# Patient Record
Sex: Female | Born: 2000
Health system: Southern US, Community
[De-identification: ages and names within clinical notes are randomized; demographics above are authoritative.]

## PROBLEM LIST (undated history)

## (undated) DIAGNOSIS — F419 Anxiety disorder, unspecified: Secondary | ICD-10-CM

## (undated) DIAGNOSIS — R569 Unspecified convulsions: Secondary | ICD-10-CM

## (undated) DIAGNOSIS — J45909 Unspecified asthma, uncomplicated: Secondary | ICD-10-CM

## (undated) HISTORY — PX: WISDOM TOOTH EXTRACTION: SHX21

## (undated) HISTORY — DX: Anxiety disorder, unspecified: F41.9

## (undated) HISTORY — DX: Unspecified convulsions: R56.9

---
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
## 2018-09-20 DIAGNOSIS — Z23 Encounter for immunization: Secondary | ICD-10-CM | POA: Diagnosis not present

## 2019-09-29 DIAGNOSIS — S5012XA Contusion of left forearm, initial encounter: Secondary | ICD-10-CM | POA: Diagnosis not present

## 2019-09-29 DIAGNOSIS — M79602 Pain in left arm: Secondary | ICD-10-CM | POA: Diagnosis not present

## 2019-11-16 ENCOUNTER — Other Ambulatory Visit: Payer: Self-pay

## 2019-11-16 DIAGNOSIS — R55 Syncope and collapse: Secondary | ICD-10-CM | POA: Diagnosis not present

## 2019-11-16 DIAGNOSIS — E876 Hypokalemia: Secondary | ICD-10-CM | POA: Diagnosis not present

## 2019-11-16 DIAGNOSIS — Z5321 Procedure and treatment not carried out due to patient leaving prior to being seen by health care provider: Secondary | ICD-10-CM | POA: Insufficient documentation

## 2019-11-16 DIAGNOSIS — J45909 Unspecified asthma, uncomplicated: Secondary | ICD-10-CM | POA: Insufficient documentation

## 2019-11-16 DIAGNOSIS — R000 Tachycardia, unspecified: Secondary | ICD-10-CM | POA: Diagnosis not present

## 2019-11-16 DIAGNOSIS — Z20828 Contact with and (suspected) exposure to other viral communicable diseases: Secondary | ICD-10-CM | POA: Diagnosis not present

## 2019-11-16 DIAGNOSIS — R569 Unspecified convulsions: Principal | ICD-10-CM | POA: Insufficient documentation

## 2019-11-16 DIAGNOSIS — G259 Extrapyramidal and movement disorder, unspecified: Secondary | ICD-10-CM | POA: Diagnosis not present

## 2019-11-17 ENCOUNTER — Emergency Department (HOSPITAL_COMMUNITY)
Admission: EM | Admit: 2019-11-17 | Discharge: 2019-11-17 | Disposition: A | Discharge status: Home / Self Care | Payer: BC Managed Care – PPO | Source: Home / Self Care

## 2019-11-17 ENCOUNTER — Observation Stay (HOSPITAL_COMMUNITY)
Admission: EM | Admit: 2019-11-17 | Discharge: 2019-11-17 | Disposition: A | Discharge status: Home / Self Care | Payer: BC Managed Care – PPO | Attending: Family Medicine | Admitting: Family Medicine

## 2019-11-17 ENCOUNTER — Observation Stay (HOSPITAL_COMMUNITY): Payer: BC Managed Care – PPO

## 2019-11-17 ENCOUNTER — Other Ambulatory Visit: Payer: Self-pay

## 2019-11-17 ENCOUNTER — Encounter (HOSPITAL_COMMUNITY): Payer: Self-pay | Admitting: *Deleted

## 2019-11-17 ENCOUNTER — Encounter (HOSPITAL_COMMUNITY): Payer: Self-pay

## 2019-11-17 DIAGNOSIS — R52 Pain, unspecified: Secondary | ICD-10-CM | POA: Diagnosis not present

## 2019-11-17 DIAGNOSIS — G4489 Other headache syndrome: Secondary | ICD-10-CM | POA: Diagnosis not present

## 2019-11-17 DIAGNOSIS — R569 Unspecified convulsions: Secondary | ICD-10-CM

## 2019-11-17 DIAGNOSIS — E876 Hypokalemia: Secondary | ICD-10-CM | POA: Diagnosis present

## 2019-11-17 DIAGNOSIS — R55 Syncope and collapse: Secondary | ICD-10-CM | POA: Insufficient documentation

## 2019-11-17 DIAGNOSIS — Z5321 Procedure and treatment not carried out due to patient leaving prior to being seen by health care provider: Secondary | ICD-10-CM | POA: Insufficient documentation

## 2019-11-17 DIAGNOSIS — R410 Disorientation, unspecified: Secondary | ICD-10-CM | POA: Diagnosis not present

## 2019-11-17 DIAGNOSIS — G259 Extrapyramidal and movement disorder, unspecified: Secondary | ICD-10-CM

## 2019-11-17 LAB — CK TOTAL AND CKMB (NOT AT ARMC)
CK, MB: 1 ng/mL (ref 0.5–5.0)
Relative Index: INVALID (ref 0.0–2.5)
Total CK: 58 U/L (ref 38–234)

## 2019-11-17 LAB — URINALYSIS, ROUTINE W REFLEX MICROSCOPIC
Bilirubin Urine: NEGATIVE
Glucose, UA: NEGATIVE mg/dL
Ketones, ur: NEGATIVE mg/dL
Leukocytes,Ua: NEGATIVE
Nitrite: NEGATIVE
Protein, ur: NEGATIVE mg/dL
Specific Gravity, Urine: 1.021 (ref 1.005–1.030)
pH: 6 (ref 5.0–8.0)

## 2019-11-17 LAB — CBC
HCT: 39.1 % (ref 36.0–46.0)
Hemoglobin: 13.7 g/dL (ref 12.0–15.0)
MCH: 31.9 pg (ref 26.0–34.0)
MCHC: 35 g/dL (ref 30.0–36.0)
MCV: 90.9 fL (ref 80.0–100.0)
Platelets: 272 10*3/uL (ref 150–400)
RBC: 4.3 MIL/uL (ref 3.87–5.11)
RDW: 11.5 % (ref 11.5–15.5)
WBC: 6.2 10*3/uL (ref 4.0–10.5)
nRBC: 0 % (ref 0.0–0.2)

## 2019-11-17 LAB — IRON AND TIBC
Iron: 21 ug/dL — ABNORMAL LOW (ref 28–170)
Saturation Ratios: 8 % — ABNORMAL LOW (ref 10.4–31.8)
TIBC: 276 ug/dL (ref 250–450)
UIBC: 255 ug/dL

## 2019-11-17 LAB — COMPREHENSIVE METABOLIC PANEL
ALT: 12 U/L (ref 0–44)
ALT: 13 U/L (ref 0–44)
AST: 18 U/L (ref 15–41)
AST: 19 U/L (ref 15–41)
Albumin: 4.3 g/dL (ref 3.5–5.0)
Albumin: 4 g/dL (ref 3.5–5.0)
Alkaline Phosphatase: 46 U/L (ref 38–126)
Alkaline Phosphatase: 42 U/L (ref 38–126)
Anion gap: 8 (ref 5–15)
Anion gap: 10 (ref 5–15)
BUN: 15 mg/dL (ref 6–20)
BUN: 13 mg/dL (ref 6–20)
CO2: 24 mmol/L (ref 22–32)
CO2: 22 mmol/L (ref 22–32)
Calcium: 9.3 mg/dL (ref 8.9–10.3)
Calcium: 8.8 mg/dL — ABNORMAL LOW (ref 8.9–10.3)
Chloride: 107 mmol/L (ref 98–111)
Chloride: 107 mmol/L (ref 98–111)
Creatinine, Ser: 0.76 mg/dL (ref 0.44–1.00)
Creatinine, Ser: 0.72 mg/dL (ref 0.44–1.00)
GFR calc Af Amer: >60 mL/min (ref >60)
GFR calc Af Amer: >60 mL/min (ref >60)
GFR calc non Af Amer: >60 mL/min (ref >60)
GFR calc non Af Amer: >60 mL/min (ref >60)
Glucose, Bld: 122 mg/dL — ABNORMAL HIGH (ref 70–99)
Glucose, Bld: 95 mg/dL (ref 70–99)
Potassium: 3.3 mmol/L — ABNORMAL LOW (ref 3.5–5.1)
Potassium: 3.5 mmol/L (ref 3.5–5.1)
Sodium: 139 mmol/L (ref 135–145)
Sodium: 139 mmol/L (ref 135–145)
Total Bilirubin: 0.8 mg/dL (ref 0.3–1.2)
Total Bilirubin: 0.7 mg/dL (ref 0.3–1.2)
Total Protein: 7.2 g/dL (ref 6.5–8.1)
Total Protein: 6.7 g/dL (ref 6.5–8.1)

## 2019-11-17 LAB — FERRITIN: Ferritin: 32 ng/mL (ref 11–307)

## 2019-11-17 LAB — HIV ANTIBODY (ROUTINE TESTING W REFLEX): HIV Screen 4th Generation wRfx: NONREACTIVE

## 2019-11-17 LAB — VITAMIN B12: Vitamin B-12: 335 pg/mL (ref 180–914)

## 2019-11-17 LAB — CBC WITH DIFFERENTIAL/PLATELET
Abs Immature Granulocytes: 0.02 10*3/uL (ref 0.00–0.07)
Basophils Absolute: 0 10*3/uL (ref 0.0–0.1)
Basophils Relative: 1 %
Eosinophils Absolute: 0.1 10*3/uL (ref 0.0–0.5)
Eosinophils Relative: 2 %
HCT: 36.9 % (ref 36.0–46.0)
Hemoglobin: 12.8 g/dL (ref 12.0–15.0)
Immature Granulocytes: 0 %
Lymphocytes Relative: 24 %
Lymphs Abs: 1.2 10*3/uL (ref 0.7–4.0)
MCH: 31.4 pg (ref 26.0–34.0)
MCHC: 34.7 g/dL (ref 30.0–36.0)
MCV: 90.4 fL (ref 80.0–100.0)
Monocytes Absolute: 0.4 10*3/uL (ref 0.1–1.0)
Monocytes Relative: 7 %
Neutro Abs: 3.2 10*3/uL (ref 1.7–7.7)
Neutrophils Relative %: 66 %
Platelets: 255 10*3/uL (ref 150–400)
RBC: 4.08 MIL/uL (ref 3.87–5.11)
RDW: 11.4 % — ABNORMAL LOW (ref 11.5–15.5)
WBC: 4.9 10*3/uL (ref 4.0–10.5)
nRBC: 0 % (ref 0.0–0.2)

## 2019-11-17 LAB — URINALYSIS, MICROSCOPIC (REFLEX)

## 2019-11-17 LAB — RAPID URINE DRUG SCREEN, HOSP PERFORMED
Amphetamines: NOT DETECTED
Barbiturates: NOT DETECTED
Benzodiazepines: NOT DETECTED
Cocaine: NOT DETECTED
Opiates: NOT DETECTED
Tetrahydrocannabinol: POSITIVE — AB

## 2019-11-17 LAB — PREGNANCY, URINE: Preg Test, Ur: NEGATIVE

## 2019-11-17 LAB — SARS CORONAVIRUS 2 (TAT 6-24 HRS): SARS Coronavirus 2: NEGATIVE

## 2019-11-17 LAB — TSH: TSH: 2.605 u[IU]/mL (ref 0.350–4.500)

## 2019-11-17 LAB — SEDIMENTATION RATE: Sed Rate: 8 mm/hr (ref 0–22)

## 2019-11-17 LAB — TROPONIN I (HIGH SENSITIVITY)
Troponin I (High Sensitivity): <2 ng/L (ref <18)
Troponin I (High Sensitivity): 2 ng/L (ref <18)

## 2019-11-17 LAB — C-REACTIVE PROTEIN: CRP: 0.5 mg/dL (ref <1.0)

## 2019-11-17 LAB — MAGNESIUM: Magnesium: 2.1 mg/dL (ref 1.7–2.4)

## 2019-11-17 MED ORDER — GADOBUTROL 1 MMOL/ML IV SOLN
5.0000 mL | Freq: Once | INTRAVENOUS | Status: AC | PRN
  Administered 2019-11-17: 5 mL via INTRAVENOUS

## 2019-11-17 MED ORDER — ACETAMINOPHEN 325 MG PO TABS: 650.0000 mg | ORAL_TABLET | Freq: Four times a day (QID) | ORAL | Status: DC | PRN

## 2019-11-17 MED ORDER — LORAZEPAM 2 MG/ML IJ SOLN
1.0000 mg | Freq: Once | INTRAMUSCULAR | Status: AC
  Administered 2019-11-17: 1 mg via INTRAVENOUS
  Filled 2019-11-17: qty 1

## 2019-11-17 MED ORDER — SODIUM CHLORIDE 0.9% FLUSH
3.0000 mL | Freq: Two times a day (BID) | INTRAVENOUS | Status: DC
  Administered 2019-11-17: 3 mL via INTRAVENOUS

## 2019-11-17 MED ORDER — DEXTROSE 5 % IV SOLN
1000.0000 mg | Freq: Once | INTRAVENOUS | Status: AC
  Administered 2019-11-17: 1000 mg via INTRAVENOUS
  Filled 2019-11-17: qty 10

## 2019-11-17 MED ORDER — SENNOSIDES-DOCUSATE SODIUM 8.6-50 MG PO TABS: 1.0000 | ORAL_TABLET | Freq: Every evening | ORAL | Status: DC | PRN

## 2019-11-17 MED ORDER — SODIUM CHLORIDE 0.9% FLUSH: 3.0000 mL | INTRAVENOUS | Status: DC | PRN

## 2019-11-17 MED ORDER — POTASSIUM CHLORIDE CRYS ER 20 MEQ PO TBCR
40.0000 meq | EXTENDED_RELEASE_TABLET | Freq: Once | ORAL | Status: AC
  Administered 2019-11-17: 40 meq via ORAL
  Filled 2019-11-17: qty 2

## 2019-11-17 MED ORDER — SODIUM CHLORIDE 0.9% FLUSH: 3.0000 mL | Freq: Two times a day (BID) | INTRAVENOUS | Status: DC

## 2019-11-17 MED ORDER — SODIUM CHLORIDE 0.9 % IV SOLN: 250.0000 mL | INTRAVENOUS | Status: DC | PRN

## 2019-11-17 MED ORDER — ONDANSETRON HCL 4 MG PO TABS: 4.0000 mg | ORAL_TABLET | Freq: Four times a day (QID) | ORAL | Status: DC | PRN

## 2019-11-17 MED ORDER — SODIUM CHLORIDE 0.9% FLUSH: 3.0000 mL | Freq: Once | INTRAVENOUS | Status: DC

## 2019-11-17 MED ORDER — ONDANSETRON HCL 4 MG/2ML IJ SOLN: 4.0000 mg | Freq: Four times a day (QID) | INTRAMUSCULAR | Status: DC | PRN

## 2019-11-17 MED ORDER — ACETAMINOPHEN 650 MG RE SUPP: 650.0000 mg | Freq: Four times a day (QID) | RECTAL | Status: DC | PRN

## 2019-11-17 NOTE — Procedures (Signed)
ELECTROENCEPHALOGRAM REPORT   Patient: Natalie Simmons       Room #: 038C EEG No. ID: [id number] Age: 18 y.o.        Sex: female Referring Physician: Pahwani Report Date:  11/17/2019        Interpreting Physician: Alexis Goodell  History: Natalie Simmons is an 18 y.o. female with shaking episodes  Medications:  None  Conditions of Recording:  This is a 21 channel routine scalp EEG performed with bipolar and monopolar montages arranged in accordance to the international 10/20 system of electrode placement. One channel was dedicated to EKG recording.  The patient is in the awake, drowsy and asleep states.  Description:  The waking background activity consists of a low voltage, symmetrical, fairly well organized, 11 Hz alpha activity, seen from the parieto-occipital and posterior temporal regions.  Low voltage fast activity, poorly organized, is seen anteriorly and is at times superimposed on more posterior regions.  A mixture of theta and alpha rhythms are seen from the central and temporal regions. The patient drowses with slowing to irregular, low voltage theta and beta activity.   The patient goes in to a light sleep with symmetrical sleep spindles, vertex central sharp transients and irregular slow activity.  No epileptiform activity is noted.   Hyperventilation was performed and produced a mild to moderate buildup but failed to elicit any abnormalities.  Intermittent photic stimulation was performed but failed to illicit any change in the tracing.    IMPRESSION: Normal electroencephalogram, awake, asleep and with activation procedures. There are no focal lateralizing or epileptiform features.   Alexis Goodell, MD Neurology [phone] 11/17/2019, 10:29 AM

## 2019-11-17 NOTE — ED Triage Notes (Signed)
The pt is c/o having some chest pain and some twitching  Then she faints for 2 months  lmp now

## 2019-11-17 NOTE — ED Notes (Signed)
Pt ambulatory to registration desk stating she has been waiting 3 hours and she came in by ambulance, she is tired and wants to go home. Went to get supplies to remove pt IV, pt took her IV out on her own. Bandage applied to site. Pt talking on phone, left ED lobby ambulatory.

## 2019-11-17 NOTE — H&P (Signed)
History and Physical    Natalie Simmons IRW:[id number] DOB: Dec 09, 2000 DOA: 11/17/2019  PCP: Dione Housekeeper, MD   Patient coming from: Home   Chief Complaint: Uncontrolled movements, transient LOC   HPI: Natalie Simmons is a 18 y.o. female with medical history significant for childhood asthma, now presenting to the emergency department for evaluation of controlled movements and transient loss of consciousness.  Patient reports history of transient loss of consciousness going back at least 3 years, was evaluated by cardiology previously with ambulatory heart monitoring and echocardiogram, and reports that the episodes have become more frequent.  She had 1 of these episodes last night, preceded by palpitations, sometimes preceded by a headache, and then followed by transient loss of consciousness lasting less than a minute.  A roommate reported that she had generalized shaking while unconscious.  Patient denies any history of incontinence during these episodes but has bit her lip.  She has had uncontrolled movements involving the bilateral lower extremities tonight.  She denies any recent fevers or chills.  Denies any focal numbness or weakness.  ED Course: Upon arrival to the ED, patient is found to be afebrile, saturating well on room air, tachycardic, and with stable blood pressure.  EKG features sinus tachycardia with rate 123.  Chemistry panel is notable for potassium of 3.3 and CBC is unremarkable.  Neurology was consulted by the ED physician, patient was given 1 g IV Depakote, 1 mg IV Ativan, and medical admission was recommended.  COVID-19 screening test has not yet resulted.  Review of Systems:  All other systems reviewed and apart from HPI, are negative.  Past Medical History:  Diagnosis Date  . Asthma     History reviewed. No pertinent surgical history.   reports that she has never smoked. She has never used smokeless tobacco. She reports that she does not drink alcohol or use  drugs.  Allergies  Allergen Reactions  . Latex Itching    Family History  Problem Relation Age of Onset  . Drug abuse Mother   . Drug abuse Father      Prior to Admission medications   Medication Sig Start Date End Date Taking? Authorizing Provider  albuterol (VENTOLIN HFA) 108 (90 Base) MCG/ACT inhaler Inhale 2 puffs into the lungs every 6 (six) hours as needed for wheezing or shortness of breath.   Yes [provider]  Ibuprofen (ADVIL) 200 MG CAPS Take 400-800 mg by mouth every 8 (eight) hours as needed (for moderate to severe headaches).   Yes [provider]  Multiple Vitamins-Calcium (ONE-A-DAY WOMENS FORMULA) TABS Take 1 tablet by mouth daily with breakfast.   Yes [provider]    Physical Exam: Vitals:   11/17/19 0145 11/17/19 0200 11/17/19 0230 11/17/19 0245  BP: (!) 102/49  100/89   Pulse: 86 95 93 83  Resp: (!) 27 (!) 23 (!) 22 19  Temp:      TempSrc:      SpO2: 99% 98% 97% 97%  Weight:      Height:        Constitutional: NAD, calm  Eyes: PERTLA, lids and conjunctivae normal ENMT: Mucous membranes are moist. Posterior pharynx clear of any exudate or lesions.   Neck: normal, supple, no masses, no thyromegaly Respiratory: clear to auscultation bilaterally, no wheezing, no crackles. No accessory muscle use.  Cardiovascular: S1 & S2 heard, regular rate and rhythm. No extremity edema.   Abdomen: No distension, no tenderness, soft. Bowel sounds active.  Musculoskeletal: no clubbing /  cyanosis. No joint deformity upper and lower extremities.    Skin: no significant rashes, lesions, ulcers. Warm, dry, well-perfused. Neurologic: CN 2-12 grossly intact. Sensation intact. Strength 5/5 in all 4 limbs. Rhythmic movements of b/l LE's.  Psychiatric: Alert and oriented to person, place, and situation. Pleasant, cooperative.      Labs on Admission: I have personally reviewed following labs and imaging studies  CBC: Recent Labs  Lab  11/17/19 0005  WBC 6.2  HGB 13.7  HCT 39.1  MCV 90.9  PLT 169   Basic Metabolic Panel: Recent Labs  Lab 11/17/19 0005  NA 139  K 3.3*  CL 107  CO2 24  GLUCOSE 122*  BUN 15  CREATININE 0.76  CALCIUM 9.3   GFR: Estimated Creatinine Clearance: 87.5 mL/min (by C-G formula based on SCr of 0.76 mg/dL). Liver Function Tests: Recent Labs  Lab 11/17/19 0005  AST 18  ALT 12  ALKPHOS 46  BILITOT 0.8  PROT 7.2  ALBUMIN 4.3   No results for input(s): LIPASE, AMYLASE in the last 168 hours. No results for input(s): AMMONIA in the last 168 hours. Coagulation Profile: No results for input(s): INR, PROTIME in the last 168 hours. Cardiac Enzymes: Recent Labs  Lab 11/17/19 0240  CKTOTAL 58  CKMB 1.0   BNP (last 3 results) No results for input(s): PROBNP in the last 8760 hours. HbA1C: No results for input(s): HGBA1C in the last 72 hours. CBG: No results for input(s): GLUCAP in the last 168 hours. Lipid Profile: No results for input(s): CHOL, HDL, LDLCALC, TRIG, CHOLHDL, LDLDIRECT in the last 72 hours. Thyroid Function Tests: No results for input(s): TSH, T4TOTAL, FREET4, T3FREE, THYROIDAB in the last 72 hours. Anemia Panel: No results for input(s): VITAMINB12, FOLATE, FERRITIN, TIBC, IRON, RETICCTPCT in the last 72 hours. Urine analysis:    Component Value Date/Time   COLORURINE YELLOW 11/17/2019 0303   APPEARANCEUR HAZY (A) 11/17/2019 0303   LABSPEC 1.021 11/17/2019 0303   PHURINE 6.0 11/17/2019 0303   GLUCOSEU NEGATIVE 11/17/2019 0303   HGBUR MODERATE (A) 11/17/2019 0303   BILIRUBINUR NEGATIVE 11/17/2019 0303   KETONESUR NEGATIVE 11/17/2019 0303   PROTEINUR NEGATIVE 11/17/2019 0303   NITRITE NEGATIVE 11/17/2019 0303   LEUKOCYTESUR NEGATIVE 11/17/2019 0303   Sepsis Labs: '@LABRCNTIP' (procalcitonin:4,lacticidven:4) )No results found for this or any previous visit (from the past 240 hour(s)).   Radiological Exams on Admission: No results found.  EKG:  Independently reviewed. Sinus tachycardia (rate 123).   Assessment/Plan   1. Seizure-like activity  - Presents with years of recurrent episodes preceded by palpitations and involving uncontrolled movements and transient LOC, becoming more frequent  - She was evaluated previously by cardiology for possible cardiac syncope and wore ambulatory heart monitor and had echocardiogram  - Neurology is consulting and much appreciated  - Ddx includes movement and seizure disorders  - She was treated in ED with 1g of Depakote and 1 mg Ativan with some improvement in uncontrolled leg movements  - Additional workup will include MRI brain with and without contrast, EEG, parathyroid, TSH, B12, magnesium, copper, heavy metals, ceruloplasmin, HIV, ESR, CRP   2. Hypokalemia  - Mild, replaced    DVT prophylaxis: SCD's  Code Status: Full  Family Communication: Discussed with patient Consults called: Neurology  Admission status: Observation    Vianne Bulls, MD [facility] Pager [phone]  If 7PM-7AM, please contact night-coverage [email or web address] Password [email or web address]  11/17/2019, 4:19 AM

## 2019-11-17 NOTE — Progress Notes (Addendum)
Interim Note  Patient appears completely back to her baseline.  I could not appreciate any twitching, myoclonic or dystonic movements seen which were described by Dr. Amie Portland in his consult.   Patient states that these episodes occur 2-3 times per week, usually precipitated by heart flutter.  Patient notes that she is going to get them and lays down, on multiple occasions patient loses consciousness.  Has not seen a neurologist.  States that she has anxiety/depression but has not seen a psychiatrist as well    Work-up so far: Urine drug screen positive for THC,  low serumiron level at 21,calcium borderline at 8.8, mildly low potassium at 3.3 Normal magnesium, sodium levels Serum copper, ceruloplasmin, heavy metals: Pending HIV: non reactive Vitamin B12:, ESR CRP normal, ferritin normal TSH normal, pending parathyroid hormone  MRI brain normal Routine EEG: normal (performed after patient received Depakote and no longer having any symptoms)  Recommendations -Patient can be discharged with close outpatient neurology outpatient follow-up -As patient has frequent episodes, consider admission for epilepsy monitoring unit versus ambulatory EEG to record episodes and rule out non-epileptic spells. -Consider 30 day cardiac monitor for episodes of heart racing -We will hold off on continuing patient on Depakote for seizures as she is of childbearing age, her EEG is not  suggestive of juvenile myoclonic epilepsy.  While her interictal routine EEG does not demonstrate seizures, does not rule out epilepsy.  Patient needs to be on seizure precautions with no driving for 6 months.  Impression: Seizure vs psychogenic nonepileptic spells Movement disorder of undetermined etiology   Per [facility] statutes, patients with seizures are not allowed to drive until they have been seizure-free for six months. Use caution when using heavy equipment or power tools. Avoid working on ladders or at  heights. Take showers instead of baths. Ensure the water temperature is not too high on the home water heater. Do not go swimming alone. Do not lock yourself in a room alone (i.e. bathroom). When caring for infants or small children, sit down when holding, feeding, or changing them to minimize risk of injury to the child in the event you have a seizure. Maintain good sleep hygiene. Avoid alcohol.    If Kerigan Narvaez has another seizure, call 911 and bring them back to the ED if:       A.  The seizure lasts longer than 5 minutes.            B.  The patient doesn't wake shortly after the seizure or has new problems such as difficulty seeing, speaking or moving following the seizure       C.  The patient was injured during the seizure       D.  The patient has a temperature over 102 F (39C)       E.  The patient vomited during the seizure and now is having trouble breathing
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
## 2019-11-17 NOTE — ED Notes (Signed)
Report given to 3 west 

## 2019-11-17 NOTE — Discharge Instructions (Signed)
Seizure, Adult A seizure is a sudden burst of abnormal electrical activity in the brain. Seizures usually last from 30 seconds to 2 minutes. They can cause many different symptoms. Usually, seizures are not harmful unless they last a long time. What are the causes? Common causes of this condition include:  Fever or infection.  Conditions that affect the brain, such as: ? A brain abnormality that you were born with. ? A brain or head injury. ? Bleeding in the brain. ? A tumor. ? Stroke. ? Brain disorders such as autism or cerebral palsy.  Low blood sugar.  Conditions that are passed from parent to child (are inherited).  Problems with substances, such as: ? Having a reaction to a drug or a medicine. ? Suddenly stopping the use of a substance (withdrawal). In some cases, the cause may not be known. A person who has repeated seizures over time without a clear cause has a condition called epilepsy. What increases the risk? You are more likely to get this condition if you have:  A family history of epilepsy.  Had a seizure in the past.  A brain disorder.  A history of head injury, lack of oxygen at birth, or strokes. What are the signs or symptoms? There are many types of seizures. The symptoms vary depending on the type of seizure you have. Examples of symptoms during a seizure include:  Shaking (convulsions).  Stiffness in the body.  Passing out (losing consciousness).  Head nodding.  Staring.  Not responding to sound or touch.  Loss of bladder control and bowel control. Some people have symptoms right before and right after a seizure happens. Symptoms before a seizure may include:  Fear.  Worry (anxiety).  Feeling like you may vomit (nauseous).  Feeling like the room is spinning (vertigo).  Feeling like you saw or heard something before (dj vu).  Odd tastes or smells.  Changes in how you see. You may see flashing lights or spots. Symptoms after a  seizure happens can include:  Confusion.  Sleepiness.  Headache.  Weakness on one side of the body. How is this treated? Most seizures will stop on their own in under 5 minutes. In these cases, no treatment is needed. Seizures that last longer than 5 minutes will usually need treatment. Treatment can include:  Medicines given through an IV tube.  Avoiding things that are known to cause your seizures. These can include medicines that you take for another condition.  Medicines to treat epilepsy.  Surgery to stop the seizures. This may be needed if medicines do not help. Follow these instructions at home: Medicines  Take over-the-counter and prescription medicines only as told by your doctor.  Do not eat or drink anything that may keep your medicine from working, such as alcohol. Activity  Do not do any activities that would be dangerous if you had another seizure, like driving or swimming. Wait until your doctor says it is safe for you to do them.  If you live in the [place] ask your local DMV (department of motor vehicles) when you can drive.  Get plenty of rest. Teaching others Teach friends and family what to do when you have a seizure. They should:  Lay you on the ground.  Protect your head and body.  Loosen any tight clothing around your neck.  Turn you on your side.  Not hold you down.  Not put anything into your mouth.  Know whether or not you need emergency care.  Stay   with you until you are better.  General instructions  Contact your doctor each time you have a seizure.  Avoid anything that gives you seizures.  Keep a seizure diary. Write down: ? What you think caused each seizure. ? What you remember about each seizure.  Keep all follow-up visits as told by your doctor. This is important. Contact a doctor if:  You have another seizure.  You have seizures more often.  There is any change in what happens during your seizures.  You keep having  seizures with treatment.  You have symptoms of being sick or having an infection. Get help right away if:  You have a seizure that: ? Lasts longer than 5 minutes. ? Is different than seizures you had before. ? Makes it harder to breathe. ? Happens after you hurt your head.  You have any of these symptoms after a seizure: ? Not being able to speak. ? Not being able to use a part of your body. ? Confusion. ? A bad headache.  You have two or more seizures in a row.  You do not wake up right after a seizure.  You get hurt during a seizure. These symptoms may be an emergency. Do not wait to see if the symptoms will go away. Get medical help right away. Call your local emergency services (911 in the [place] Do not drive yourself to the hospital. Summary  Seizures usually last from 30 seconds to 2 minutes. Usually, they are not harmful unless they last a long time.  Do not eat or drink anything that may keep your medicine from working, such as alcohol.  Teach friends and family what to do when you have a seizure.  Contact your doctor each time you have a seizure. This information is not intended to replace advice given to you by your health care provider. Make sure you discuss any questions you have with your health care provider. Document Released: 05/02/2008 Document Revised: 02/01/2019 Document Reviewed: 02/01/2019 Elsevier Patient Education  2020 [employer]  

## 2019-11-17 NOTE — Progress Notes (Signed)
EEG complete - results pending 

## 2019-11-17 NOTE — Progress Notes (Signed)
AVS reviewed with patient and patient given a copy to take home. All lines removed, patient dressed, belongings packed, and patient taken via wheelchair to girlfriend's car for discharge.

## 2019-11-17 NOTE — ED Notes (Signed)
ED TO INPATIENT HANDOFF REPORT  ED Nurse Name and Phone #: Alycia Rossetti [phone]  S Name/Age/Gender Natalie Simmons 18 y.o. female Room/Bed: 038C/038C  Code Status   Code Status: Full Code  Home/SNF/Other Home Patient oriented to: self, place, time and situation Is this baseline? Yes   Triage Complete: Triage complete  Chief Complaint Seizure-like activity [facility]) [R56.9]  Triage Note The pt is c/o having some chest pain and some twitching  Then she faints for 2 months  lmp now     Allergies Allergies  Allergen Reactions  . Latex Itching    Level of Care/Admitting Diagnosis ED Disposition    ED Disposition Condition Comment   Admit  Hospital Area: [facility] [[id number]]  Level of Care: Telemetry Medical [104]  I expect the patient will be discharged within 24 hours: Yes  LOW acuity---Tx typically complete <24 hrs---ACUTE conditions typically can be evaluated <24 hours---LABS likely to return to acceptable levels <24 hours---IS near functional baseline---EXPECTED to return to current living arrangement---NOT newly hypoxic: Does not meet criteria for 5C-Observation unit  Covid Evaluation: Asymptomatic Screening Protocol (No Symptoms)  Diagnosis: Seizure-like activity [facility]) [[id number]]  Admitting Physician: Briscoe Deutscher [[id number]]  Attending Physician: Briscoe Deutscher [[id number]]       B Medical/Surgery History Past Medical History:  Diagnosis Date  . Asthma    History reviewed. No pertinent surgical history.   A IV Location/Drains/Wounds Patient Lines/Drains/Airways Status   Active Line/Drains/Airways    Name:   Placement date:   Placement time:   Site:   Days:   Peripheral IV 11/17/19 Left Antecubital   11/17/19    0241    Antecubital   less than 1          Intake/Output Last 24 hours No intake or output data in the 24 hours ending 11/17/19 1012  Labs/Imaging Results for orders placed or performed during the hospital encounter of 11/17/19 (from  the past 48 hour(s))  Comprehensive metabolic panel     Status: Abnormal   Collection Time: 11/17/19 12:05 AM  Result Value Ref Range   Sodium 139 135 - 145 mmol/L   Potassium 3.3 (L) 3.5 - 5.1 mmol/L   Chloride 107 98 - 111 mmol/L   CO2 24 22 - 32 mmol/L   Glucose, Bld 122 (H) 70 - 99 mg/dL   BUN 15 6 - 20 mg/dL   Creatinine, Ser 6.29 0.44 - 1.00 mg/dL   Calcium 9.3 8.9 - 52.8 mg/dL   Total Protein 7.2 6.5 - 8.1 g/dL   Albumin 4.3 3.5 - 5.0 g/dL   AST 18 15 - 41 U/L   ALT 12 0 - 44 U/L   Alkaline Phosphatase 46 38 - 126 U/L   Total Bilirubin 0.8 0.3 - 1.2 mg/dL   GFR calc non Af Amer >60 >60 mL/min   GFR calc Af Amer >60 >60 mL/min   Anion gap 8 5 - 15    Comment: Performed at [facility], [address] [place] [address], [place], [place] [address]  CBC     Status: None   Collection Time: 11/17/19 12:05 AM  Result Value Ref Range   WBC 6.2 4.0 - 10.5 K/uL   RBC 4.30 3.87 - 5.11 MIL/uL   Hemoglobin 13.7 12.0 - 15.0 g/dL   HCT 40.1 02.7 - 25.3 %   MCV 90.9 80.0 - 100.0 fL   MCH 31.9 26.0 - 34.0 pg   MCHC 35.0 30.0 - 36.0 g/dL  RDW 11.5 11.5 - 15.5 %   Platelets 272 150 - 400 K/uL   nRBC 0.0 0.0 - 0.2 %    Comment: Performed at [facility], [place] [address] [place], [place] [address]  Troponin I (High Sensitivity)     Status: None   Collection Time: 11/17/19 12:05 AM  Result Value Ref Range   Troponin I (High Sensitivity) <2 <18 ng/L    Comment: Performed at [facility] [address], [place], [place] [address]  Troponin I (High Sensitivity)     Status: None   Collection Time: 11/17/19  2:40 AM  Result Value Ref Range   Troponin I (High Sensitivity) 2 <18 ng/L    Comment: (NOTE) Elevated high sensitivity troponin I (hsTnI) values and significant  changes across serial measurements may suggest ACS but many other  chronic and acute conditions are known to elevate hsTnI results.  Refer to the "Links" section for chest pain algorithms and additional   guidance. Performed at [facility], [place] [address], [place], [place] [address]   CK total and CKMB (cardiac) not at [facility]     Status: None   Collection Time: 11/17/19  2:40 AM  Result Value Ref Range   Total CK 58 38 - 234 U/L   CK, MB 1.0 0.5 - 5.0 ng/mL   Relative Index RELATIVE INDEX IS INVALID 0.0 - 2.5    Comment: WHEN CK < 100 U/L        Performed at [facility] [address] [place], [place] [address]   Rapid urine drug screen (hospital performed)     Status: Abnormal   Collection Time: 11/17/19  3:03 AM  Result Value Ref Range   Opiates NONE DETECTED NONE DETECTED   Cocaine NONE DETECTED NONE DETECTED   Benzodiazepines NONE DETECTED NONE DETECTED   Amphetamines NONE DETECTED NONE DETECTED   Tetrahydrocannabinol POSITIVE (A) NONE DETECTED   Barbiturates NONE DETECTED NONE DETECTED    Comment: (NOTE) DRUG SCREEN FOR MEDICAL PURPOSES ONLY.  IF CONFIRMATION IS NEEDED FOR ANY PURPOSE, NOTIFY LAB WITHIN 5 DAYS. LOWEST DETECTABLE LIMITS FOR URINE DRUG SCREEN Drug Class                     Cutoff (ng/mL) Amphetamine and metabolites    1000 Barbiturate and metabolites    200 Benzodiazepine                 767 Tricyclics and metabolites     300 Opiates and metabolites        300 Cocaine and metabolites        300 THC                            50 Performed at [facility], [place] [address] [place], [place] [address]   Urinalysis, Routine w reflex microscopic     Status: Abnormal   Collection Time: 11/17/19  3:03 AM  Result Value Ref Range   Color, Urine YELLOW YELLOW   APPearance HAZY (A) CLEAR   Specific Gravity, Urine 1.021 1.005 - 1.030   pH 6.0 5.0 - 8.0   Glucose, UA NEGATIVE NEGATIVE mg/dL   Hgb urine dipstick MODERATE (A) NEGATIVE   Bilirubin Urine NEGATIVE NEGATIVE   Ketones, ur NEGATIVE NEGATIVE mg/dL   Protein, ur NEGATIVE NEGATIVE mg/dL   Nitrite NEGATIVE NEGATIVE   Leukocytes,Ua NEGATIVE NEGATIVE  Comment: Performed at [facility], [address] [place], [place] [address]  Pregnancy, urine     Status: None   Collection Time: 11/17/19  3:03 AM  Result Value Ref Range   Preg Test, Ur NEGATIVE NEGATIVE    Comment: Performed at [facility], [address], [place], [place] [address]  Urinalysis, Microscopic (reflex)     Status: Abnormal   Collection Time: 11/17/19  3:03 AM  Result Value Ref Range   RBC / HPF 11-20 0 - 5 RBC/hpf   WBC, UA 0-5 0 - 5 WBC/hpf   Bacteria, UA RARE (A) NONE SEEN   Squamous Epithelial / LPF 0-5 0 - 5   Mucus PRESENT    Ca Oxalate Crys, UA PRESENT     Comment: Performed at [facility], [address] [place], [place] [address]  Comprehensive metabolic panel     Status: Abnormal   Collection Time: 11/17/19  4:46 AM  Result Value Ref Range   Sodium 139 135 - 145 mmol/L   Potassium 3.5 3.5 - 5.1 mmol/L   Chloride 107 98 - 111 mmol/L   CO2 22 22 - 32 mmol/L   Glucose, Bld 95 70 - 99 mg/dL   BUN 13 6 - 20 mg/dL   Creatinine, Ser 9.14 0.44 - 1.00 mg/dL   Calcium 8.8 (L) 8.9 - 10.3 mg/dL   Total Protein 6.7 6.5 - 8.1 g/dL   Albumin 4.0 3.5 - 5.0 g/dL   AST 19 15 - 41 U/L   ALT 13 0 - 44 U/L   Alkaline Phosphatase 42 38 - 126 U/L   Total Bilirubin 0.7 0.3 - 1.2 mg/dL   GFR calc non Af Amer >60 >60 mL/min   GFR calc Af Amer >60 >60 mL/min   Anion gap 10 5 - 15    Comment: Performed at [facility], [address] [place], [place] [address]  Magnesium     Status: None   Collection Time: 11/17/19  4:46 AM  Result Value Ref Range   Magnesium 2.1 1.7 - 2.4 mg/dL    Comment: Performed at [facility], [address], [place], [place] [address]  CBC WITH DIFFERENTIAL     Status: Abnormal   Collection Time: 11/17/19  4:46 AM  Result Value Ref Range   WBC 4.9 4.0 - 10.5 K/uL   RBC 4.08 3.87 - 5.11 MIL/uL   Hemoglobin 12.8 12.0 - 15.0 g/dL   HCT 86.5 78.4 - 69.6 %   MCV 90.4 80.0 - 100.0 fL   MCH 31.4 26.0 - 34.0 pg   MCHC 34.7 30.0 - 36.0 g/dL    RDW 29.5 (L) 28.4 - 15.5 %   Platelets 255 150 - 400 K/uL   nRBC 0.0 0.0 - 0.2 %   Neutrophils Relative % 66 %   Neutro Abs 3.2 1.7 - 7.7 K/uL   Lymphocytes Relative 24 %   Lymphs Abs 1.2 0.7 - 4.0 K/uL   Monocytes Relative 7 %   Monocytes Absolute 0.4 0.1 - 1.0 K/uL   Eosinophils Relative 2 %   Eosinophils Absolute 0.1 0.0 - 0.5 K/uL   Basophils Relative 1 %   Basophils Absolute 0.0 0.0 - 0.1 K/uL   Immature Granulocytes 0 %   Abs Immature Granulocytes 0.02 0.00 - 0.07 K/uL    Comment: Performed at [facility], [address], [place], [place] [address]  Sedimentation rate  Status: None   Collection Time: 11/17/19  4:46 AM  Result Value Ref Range   Sed Rate 8 0 - 22 mm/hr    Comment: Performed at [facility], [address], [place], [place] [address]  C-reactive protein     Status: None   Collection Time: 11/17/19  4:46 AM  Result Value Ref Range   CRP 0.5 <1.0 mg/dL    Comment: Performed at [facility], [address], [place], [place] [address]  Iron and TIBC     Status: Abnormal   Collection Time: 11/17/19  4:46 AM  Result Value Ref Range   Iron 21 (L) 28 - 170 ug/dL   TIBC 098 119 - 147 ug/dL   Saturation Ratios 8 (L) 10.4 - 31.8 %   UIBC 255 ug/dL    Comment: Performed at [facility], [address] [place], [place] [address]  Ferritin     Status: None   Collection Time: 11/17/19  4:46 AM  Result Value Ref Range   Ferritin 32 11 - 307 ng/mL    Comment: Performed at [facility], [address], [place], [place] [address]  TSH     Status: None   Collection Time: 11/17/19  4:46 AM  Result Value Ref Range   TSH 2.605 0.350 - 4.500 uIU/mL    Comment: Performed by a 3rd Generation assay with a functional sensitivity of <=0.01 uIU/mL. Performed at [facility], [address], [place], [place] [address]    MR Brain W and Wo Contrast  Result Date: 11/17/2019 CLINICAL DATA:  Initial evaluation for acute seizure. EXAM: MRI  HEAD WITHOUT AND WITH CONTRAST TECHNIQUE: Multiplanar, multiecho pulse sequences of the brain and surrounding structures were obtained without and with intravenous contrast. CONTRAST:  80mL GADAVIST GADOBUTROL 1 MMOL/ML IV SOLN COMPARISON:  None. FINDINGS: Brain: Cerebral volume within normal limits for patient age. No focal parenchymal signal abnormality identified. No abnormal foci of restricted diffusion to suggest acute or subacute ischemia. Gray-white matter differentiation well maintained. No encephalomalacia to suggest chronic infarction. No foci of susceptibility artifact to suggest acute or chronic intracranial hemorrhage. No mass lesion, midline shift or mass effect. No hydrocephalus. No extra-axial fluid collection. Major dural sinuses are grossly patent. No intrinsic temporal lobe abnormality. Pituitary gland and suprasellar region are normal. Midline structures intact and normal. No abnormal enhancement. Vascular: Major intracranial vascular flow voids well maintained and normal in appearance. Skull and upper cervical spine: Craniocervical junction normal. Visualized upper cervical spine within normal limits. Bone marrow signal intensity normal. No scalp soft tissue abnormality. Sinuses/Orbits: Globes and orbital soft tissues within normal limits. Paranasal sinuses are clear. No mastoid effusion. Inner ear structures normal. Other: None. IMPRESSION: Normal brain MRI.  No acute intracranial abnormality identified. Electronically Signed   By: Rise Mu M.D.   On: 11/17/2019 04:54    Pending Labs Unresulted Labs (From admission, onward)    Start     Ordered   11/17/19 0321  Parathyroid hormone, intact (no Ca)  Once,   STAT     11/17/19 0321   11/17/19 0321  Vitamin B12  Once,   STAT     11/17/19 0321   11/17/19 0318  Copper, serum  Once,   STAT     11/17/19 0321   11/17/19 0318  Ceruloplasmin  Once,   STAT     11/17/19 0321   11/17/19 0318  Heavy metals, blood  Once,  STAT      11/17/19 0321   11/17/19 0312  HIV Antibody (routine testing w rflx)  (HIV Antibody (Routine testing w reflex) panel)  Once,   STAT     11/17/19 0311   11/17/19 0249  SARS CORONAVIRUS 2 (TAT 6-24 HRS) Nasopharyngeal Nasopharyngeal Swab  (Tier 3 (TAT 6-24 hrs))  Once,   STAT    Question Answer Comment  Is this test for diagnosis or screening Screening   Symptomatic for COVID-19 as defined by CDC No   Hospitalized for COVID-19 No   Admitted to ICU for COVID-19 No   Previously tested for COVID-19 No   Resident in a congregate (group) care setting No   Employed in healthcare setting No   Pregnant No      11/17/19 0248          Vitals/Pain Today's Vitals   11/17/19 0615 11/17/19 0630 11/17/19 0745 11/17/19 0900  BP:  93/63 100/65 (!) 90/56  Pulse:   74 76  Resp: 19 18 17 15   Temp:   98.3 F (36.8 C)   TempSrc:   Oral   SpO2:   98% 98%  Weight:      Height:      PainSc:        Isolation Precautions No active isolations  Medications Medications  sodium chloride flush (NS) 0.9 % injection 3 mL (3 mLs Intravenous Not Given 11/17/19 0055)  sodium chloride flush (NS) 0.9 % injection 3 mL (3 mLs Intravenous Not Given 11/17/19 0902)  sodium chloride flush (NS) 0.9 % injection 3 mL (3 mLs Intravenous Not Given 11/17/19 0902)  sodium chloride flush (NS) 0.9 % injection 3 mL (has no administration in time range)  0.9 %  sodium chloride infusion (has no administration in time range)  acetaminophen (TYLENOL) tablet 650 mg (has no administration in time range)    Or  acetaminophen (TYLENOL) suppository 650 mg (has no administration in time range)  senna-docusate (Senokot-S) tablet 1 tablet (has no administration in time range)  ondansetron (ZOFRAN) tablet 4 mg (has no administration in time range)    Or  ondansetron (ZOFRAN) injection 4 mg (has no administration in time range)  LORazepam (ATIVAN) injection 1 mg (1 mg Intravenous Given 11/17/19 0246)  valproate (DEPACON) 1,000 mg in  dextrose 5 % 50 mL IVPB (0 mg Intravenous Stopped 11/17/19 0333)  potassium chloride SA (KLOR-CON) CR tablet 40 mEq (40 mEq Oral Given 11/17/19 0317)  gadobutrol (GADAVIST) 1 MMOL/ML injection 5 mL (5 mLs Intravenous Contrast Given 11/17/19 0419)    Mobility walks Low fall risk   Focused Assessments    R Recommendations: See Admitting Provider Note  Report given to:   Additional Notes:

## 2019-11-17 NOTE — ED Provider Notes (Signed)
[facility] EMERGENCY DEPARTMENT Provider Note   CSN: [id number] Arrival date & time: 11/16/19  2337     History No chief complaint on file.   Natalie Simmons is a 18 y.o. female.  Patient to ED with concern for possible seizure activity. She describes episodes that begin with burning in her chest, generalized muscle tightening, fast heart rate palpitations and a loss of consciousness. Tonight this episode was witnessed by her roommate who reports body jerking that lasted about 30 seconds. The patient feels she is mildly confused when she wakes but returns to baseline around 2 minutes. Sometimes these episodes involve headache and sometimes they don't. No urinary incontinence, tongue biting or vomiting. She states she has been having these episodes for about 2 years, now becoming more frequent with 2-3 episodes weekly. She was sent to cardiology by her PCP earlier in the year for palpitations and syncope who noted fluctuant heart rate but nothing else was determined that would cause symptoms. No further outpatient work up has been done. Currently, she is experiencing involuntary jerking of bilateral LE"s, which she states has happened in the past. No fever, SOB, recent illness. She denies drug and alcohol use.   The history is provided by the patient. No language interpreter was used.       Past Medical History:  Diagnosis Date  . Asthma     There are no problems to display for this patient.   History reviewed. No pertinent surgical history.   OB History   No obstetric history on file.     No family history on file.  Social History   Tobacco Use  . Smoking status: Never Smoker  . Smokeless tobacco: Never Used  Substance Use Topics  . Alcohol use: No  . Drug use: No    Home Medications Prior to Admission medications   Medication Sig Start Date End Date Taking? Authorizing Provider  ibuprofen (ADVIL,MOTRIN) 800 MG tablet Take 1 tablet (800 mg total)  by mouth every 8 (eight) hours as needed for moderate pain. 08/11/17   Bethann Berkshire, MD    Allergies    Patient has no known allergies.  Review of Systems   Review of Systems  Constitutional: Negative for chills and fever.  HENT: Negative.   Eyes: Negative for visual disturbance.  Respiratory: Negative.   Cardiovascular: Positive for palpitations.  Gastrointestinal: Negative.  Negative for vomiting.  Musculoskeletal: Positive for myalgias.  Skin: Negative.   Neurological:       See HPI.    Physical Exam Updated Vital Signs BP 118/62 (BP Location: Right Arm)   Pulse (!) 124   Temp 98 F (36.7 C) (Oral)   Resp 18   Ht 5\' 4"  (1.626 m)   Wt 49.4 kg   LMP 11/17/2019   SpO2 100%   BMI 18.71 kg/m   Physical Exam Vitals and nursing note reviewed.  Constitutional:      Appearance: She is well-developed.  HENT:     Head: Normocephalic.  Cardiovascular:     Rate and Rhythm: Regular rhythm. Tachycardia present.  Pulmonary:     Effort: Pulmonary effort is normal.     Breath sounds: Normal breath sounds. No wheezing, rhonchi or rales.  Abdominal:     General: Bowel sounds are normal.     Palpations: Abdomen is soft.     Tenderness: There is no abdominal tenderness. There is no guarding or rebound.  Musculoskeletal:        General: Normal  range of motion.     Cervical back: Normal range of motion and neck supple.  Skin:    General: Skin is warm and dry.     Findings: No rash.  Neurological:     Mental Status: She is alert.     Comments: LE's bilaterally have persistent spastic jerking movements. These are felt involuntary. A&O, speech normal, no pronator drift. No change in sensation. CN's 3-12 grossly intact.      ED Results / Procedures / Treatments   Labs (all labs ordered are listed, but only abnormal results are displayed) Labs Reviewed  COMPREHENSIVE METABOLIC PANEL - Abnormal; Notable for the following components:      Result Value   Potassium 3.3 (*)     Glucose, Bld 122 (*)    All other components within normal limits  CBC  CK TOTAL AND CKMB (NOT AT [facility])  RAPID URINE DRUG SCREEN, HOSP PERFORMED  URINALYSIS, ROUTINE W REFLEX MICROSCOPIC  PREGNANCY, URINE  TROPONIN I (HIGH SENSITIVITY)  TROPONIN I (HIGH SENSITIVITY)    EKG None  Radiology No results found.  Procedures Procedures (including critical care time)  Medications Ordered in ED Medications  sodium chloride flush (NS) 0.9 % injection 3 mL (3 mLs Intravenous Not Given 11/17/19 0055)    ED Course  I have reviewed the triage vital signs and the nursing notes.  Pertinent labs & imaging results that were available during my care of the patient were reviewed by me and considered in my medical decision making (see chart for details).    MDM Rules/Calculators/A&P                      Patient to ED with symptoms detailed in the HPI.   Discussed the patient's presentation with Dr. Rory Percy, neuro-hospitalist, who feels episodes may represent seizure, possible movement disorder. Recommends MRI (w and w/o), admission to medicine and morning EEG. Medications ordered per Dr. Rory Percy.   Hospitalist paged for admission. COVID pending. Patient updated on plan. All questions answered.  Final Clinical Impression(s) / ED Diagnoses Final diagnoses:  None   1. Possible seizures 2. Tachycardia  Rx / DC Orders ED Discharge Orders    None       Charlann Lange, PA-C 11/17/19 0258    Fatima Blank, MD 11/17/19 [phone]

## 2019-11-17 NOTE — ED Notes (Signed)
Breakfast ordered 

## 2019-11-17 NOTE — Discharge Summary (Signed)
Physician Discharge Summary  Cohen Doleman MVE:[id number] DOB: [birth year] DOA: 11/17/2019  PCP: Dione Housekeeper, MD  Admit date: 11/17/2019 Discharge date: 11/17/2019  Admitted From: Home Disposition: Home  Recommendations for Outpatient Follow-up:  1. Follow up with PCP in 1-2 weeks 2. Follow with neurology in 2 weeks 3. Please obtain BMP/CBC in one week 4. Please follow up on the following pending results:  Home Health: None Equipment/Devices: None  Discharge Condition: Stable CODE STATUS: Full code Diet recommendation: Regular  Subjective: Seen and examined.  She has no complaints.  Has not had any more seizure activity or movement disorder.  HPI: Natalie Simmons is a 18 y.o. female with medical history significant for childhood asthma, now presenting to the emergency department for evaluation of controlled movements and transient loss of consciousness.  Patient reports history of transient loss of consciousness going back at least 3 years, was evaluated by cardiology previously with ambulatory heart monitoring and echocardiogram, and reports that the episodes have become more frequent.  She had 1 of these episodes last night, preceded by palpitations, sometimes preceded by a headache, and then followed by transient loss of consciousness lasting less than a minute.  A roommate reported that she had generalized shaking while unconscious.  Patient denies any history of incontinence during these episodes but has bit her lip.  She has had uncontrolled movements involving the bilateral lower extremities tonight.  She denies any recent fevers or chills.  Denies any focal numbness or weakness.  ED Course: Upon arrival to the ED, patient is found to be afebrile, saturating well on room air, tachycardic, and with stable blood pressure.  EKG features sinus tachycardia with rate 123.  Chemistry panel is notable for potassium of 3.3 and CBC is unremarkable.  Neurology was consulted by the ED  physician, patient was given 1 g IV Depakote, 1 mg IV Ativan, and medical admission was recommended.  COVID-19 screening test negative  Brief/Interim Summary: Patient was admitted under hospitalist service for seizure-like activity and she was seen by neurology.  PTH, TSH, B12 and magnesium levels, ESR, CRP, HIV were checked and all of them were normal.  Heavy metal screening still pending.  MRI of the brain without any structural issue or any other source of seizure.  EEG with no epileptiform-like activity.  UA within normal limits.  Pregnancy test negative.  Mild hypokalemia of 3.3 which was replaced and now normal.  Patient did not have any other seizure-like activity or any movement disorder.  Patient did receive some Ativan and Depakote in the ER with some relief of those movement disorders.  She was seen by neurology once again today and due to the fact that she does not have any movement disorder now and has not had any seizure activity so they have cleared her to be discharged home with no antiseizure medications since there was no evidence of it.  However they have recommended close follow-up with neurology as an outpatient so I have advised her to discuss this with her PCP and schedule a follow-up with neurology in 2 weeks.  She will see PCP within a week.  Her final impression is seizure versus psychogenic neuropathic spells or movement disorder of undetermined etiology.  She has been advised of the following recommendations.  She is agreeable with discharge plan.  Per [facility] statutes, patients with seizures are not allowed to drive until they have been seizure-free for six months. Use caution when using heavy equipment or power tools. Avoid working on  ladders or at heights. Take showers instead of baths. Ensure the water temperature is not too high on the home water heater. Do not go swimming alone. Do not lock yourself in a room alone (i.e. bathroom). When caring for infants or small  children, sit down when holding, feeding, or changing them to minimize risk of injury to the child in the event you have a seizure. Maintain good sleep hygiene. Avoid alcohol.    If Rakesha Dalporto has another seizure, call 911 and bring them back to the ED if: A. The seizure lasts longer than 5 minutes.  B. The patient doesn't wake shortly after the seizure or has new problems such as difficulty seeing, speaking or moving following the seizure C. The patient was injured during the seizure D. The patient has a temperature over 102 F (39C) E. The patient vomited during the seizure and now is having trouble breathing   Discharge Diagnoses:  Principal Problem:   Seizure-like activity [facility]) Active Problems:   Hypokalemia    Discharge Instructions  Discharge Instructions    Discharge patient   Complete by: As directed    Discharge disposition: 01-Home or Self Care   Discharge patient date: 11/17/2019     Allergies as of 11/17/2019      Reactions   Latex Itching      Medication List    TAKE these medications   Advil 200 MG Caps Generic drug: Ibuprofen Take 400-800 mg by mouth every 8 (eight) hours as needed (for moderate to severe headaches).   One-A-Day Womens Formula Tabs Take 1 tablet by mouth daily with breakfast.   Ventolin HFA 108 (90 Base) MCG/ACT inhaler Generic drug: albuterol Inhale 2 puffs into the lungs every 6 (six) hours as needed for wheezing or shortness of breath.      Follow-up Information    Dione Housekeeper, MD Follow up in 1 week(s).   Specialty: Family Medicine Contact information: [address] [place] [address] [place] Follow up in 2 week(s).   Contact information: [place], [place] [address] [phone]         Allergies  Allergen Reactions  . Latex Itching    Consultations:  Neurology   Procedures/Studies: MR Brain W and Wo Contrast  Result Date: 11/17/2019 CLINICAL DATA:  Initial evaluation for acute seizure. EXAM: MRI HEAD WITHOUT AND WITH CONTRAST TECHNIQUE: Multiplanar, multiecho pulse sequences of the brain and surrounding structures were obtained without and with intravenous contrast. CONTRAST:  63m GADAVIST GADOBUTROL 1 MMOL/ML IV SOLN COMPARISON:  None. FINDINGS: Brain: Cerebral volume within normal limits for patient age. No focal parenchymal signal abnormality identified. No abnormal foci of restricted diffusion to suggest acute or subacute ischemia. Gray-white matter differentiation well maintained. No encephalomalacia to suggest chronic infarction. No foci of susceptibility artifact to suggest acute or chronic intracranial hemorrhage. No mass lesion, midline shift or mass effect. No hydrocephalus. No extra-axial fluid collection. Major dural sinuses are grossly patent. No intrinsic temporal lobe abnormality. Pituitary gland and suprasellar region are normal. Midline structures intact and normal. No abnormal enhancement. Vascular: Major intracranial vascular flow voids well maintained and normal in appearance. Skull and upper cervical spine: Craniocervical junction normal. Visualized upper cervical spine within normal limits. Bone marrow signal intensity normal. No scalp soft tissue abnormality. Sinuses/Orbits: Globes and orbital soft tissues within normal limits. Paranasal sinuses are clear. No mastoid effusion. Inner ear structures normal. Other: None. IMPRESSION: Normal  brain MRI.  No acute intracranial abnormality identified. Electronically Signed   By: Jeannine Boga M.D.   On: 11/17/2019 04:54   EEG adult  Result Date: 11/17/2019 Alexis Goodell, MD     11/17/2019 10:30 AM ELECTROENCEPHALOGRAM REPORT Patient: Aslee Such       Room #: 038C EEG No. ID: [id number] Age: 18 y.o.        Sex: female Referring Physician: Azure Budnick Report Date:  11/17/2019        Interpreting Physician: Alexis Goodell History: Hue Steveson is an 18 y.o. female with shaking episodes Medications: None Conditions of Recording:  This is a 21 channel routine scalp EEG performed with bipolar and monopolar montages arranged in accordance to the international 10/20 system of electrode placement. One channel was dedicated to EKG recording. The patient is in the awake, drowsy and asleep states. Description:  The waking background activity consists of a low voltage, symmetrical, fairly well organized, 11 Hz alpha activity, seen from the parieto-occipital and posterior temporal regions.  Low voltage fast activity, poorly organized, is seen anteriorly and is at times superimposed on more posterior regions.  A mixture of theta and alpha rhythms are seen from the central and temporal regions. The patient drowses with slowing to irregular, low voltage theta and beta activity.  The patient goes in to a light sleep with symmetrical sleep spindles, vertex central sharp transients and irregular slow activity. No epileptiform activity is noted.  Hyperventilation was performed and produced a mild to moderate buildup but failed to elicit any abnormalities.  Intermittent photic stimulation was performed but failed to illicit any change in the tracing.  IMPRESSION: Normal electroencephalogram, awake, asleep and with activation procedures. There are no focal lateralizing or epileptiform features. Alexis Goodell, MD Neurology [phone] 11/17/2019, 10:29 AM      Discharge Exam: Vitals:   11/17/19 1230 11/17/19 1257  BP:  (!) 101/53  Pulse: 60 74  Resp: 16 18  Temp:  98 F (36.7 C)  SpO2: 99% 98%   Vitals:   11/17/19 1100 11/17/19 1200 11/17/19 1230 11/17/19 1257  BP: 102/65 96/60  (!) 101/53  Pulse:   60 74  Resp: '17 18 16 18  ' Temp:    98 F (36.7 C)  TempSrc:    Oral  SpO2:   99% 98%  Weight:      Height:        General: Pt is alert, awake, not in acute distress Cardiovascular:  RRR, S1/S2 +, no rubs, no gallops Respiratory: CTA bilaterally, no wheezing, no rhonchi Abdominal: Soft, NT, ND, bowel sounds + Extremities: no edema, no cyanosis    The results of significant diagnostics from this hospitalization (including imaging, microbiology, ancillary and laboratory) are listed below for reference.     Microbiology: Recent Results (from the past 240 hour(s))  SARS CORONAVIRUS 2 (TAT 6-24 HRS) Nasopharyngeal Nasopharyngeal Swab     Status: None   Collection Time: 11/17/19  2:51 AM   Specimen: Nasopharyngeal Swab  Result Value Ref Range Status   SARS Coronavirus 2 NEGATIVE NEGATIVE Final    Comment: (NOTE) SARS-CoV-2 target nucleic acids are NOT DETECTED. The SARS-CoV-2 RNA is generally detectable in upper and lower respiratory specimens during the acute phase of infection. Negative results do not preclude SARS-CoV-2 infection, do not rule out co-infections with other pathogens, and should not be used as the sole basis for treatment or other patient management decisions. Negative results must be combined with clinical observations, patient history, and epidemiological information.  The expected result is Negative. Fact Sheet for Patients: SugarRoll.be Fact Sheet for Healthcare Providers: https://www.woods-mathews.com/ This test is not yet approved or cleared by the [place] FDA and  has been authorized for detection and/or diagnosis of SARS-CoV-2 by FDA under an Emergency Use Authorization (EUA). This EUA will remain  in effect (meaning this test can be used) for the duration of the COVID-19 declaration under Section 56 4(b)(1) of the Act, 21 U.S.C. section 360bbb-3(b)(1), unless the authorization is terminated or revoked sooner. Performed at [facility], [place] [address] [place], [place] [address]      Labs: BNP (last 3 results) No results for input(s): BNP in the last 8760 hours. Basic Metabolic  Panel: Recent Labs  Lab 11/17/19 0005 11/17/19 0446  NA 139 139  K 3.3* 3.5  CL 107 107  CO2 24 22  GLUCOSE 122* 95  BUN 15 13  CREATININE 0.76 0.72  CALCIUM 9.3 8.8*  MG  --  2.1   Liver Function Tests: Recent Labs  Lab 11/17/19 0005 11/17/19 0446  AST 18 19  ALT 12 13  ALKPHOS 46 42  BILITOT 0.8 0.7  PROT 7.2 6.7  ALBUMIN 4.3 4.0   No results for input(s): LIPASE, AMYLASE in the last 168 hours. No results for input(s): AMMONIA in the last 168 hours. CBC: Recent Labs  Lab 11/17/19 0005 11/17/19 0446  WBC 6.2 4.9  NEUTROABS  --  3.2  HGB 13.7 12.8  HCT 39.1 36.9  MCV 90.9 90.4  PLT 272 255   Cardiac Enzymes: Recent Labs  Lab 11/17/19 0240  CKTOTAL 58  CKMB 1.0   BNP: Invalid input(s): POCBNP CBG: No results for input(s): GLUCAP in the last 168 hours. D-Dimer No results for input(s): DDIMER in the last 72 hours. Hgb A1c No results for input(s): HGBA1C in the last 72 hours. Lipid Profile No results for input(s): CHOL, HDL, LDLCALC, TRIG, CHOLHDL, LDLDIRECT in the last 72 hours. Thyroid function studies Recent Labs    11/17/19 0446  TSH 2.605   Anemia work up Recent Labs    11/17/19 0446  VITAMINB12 335  FERRITIN 32  TIBC 276  IRON 21*   Urinalysis    Component Value Date/Time   COLORURINE YELLOW 11/17/2019 0303   APPEARANCEUR HAZY (A) 11/17/2019 0303   LABSPEC 1.021 11/17/2019 0303   PHURINE 6.0 11/17/2019 0303   GLUCOSEU NEGATIVE 11/17/2019 0303   HGBUR MODERATE (A) 11/17/2019 0303   BILIRUBINUR NEGATIVE 11/17/2019 0303   KETONESUR NEGATIVE 11/17/2019 0303   PROTEINUR NEGATIVE 11/17/2019 0303   NITRITE NEGATIVE 11/17/2019 0303   LEUKOCYTESUR NEGATIVE 11/17/2019 0303   Sepsis Labs Invalid input(s): PROCALCITONIN,  WBC,  LACTICIDVEN Microbiology Recent Results (from the past 240 hour(s))  SARS CORONAVIRUS 2 (TAT 6-24 HRS) Nasopharyngeal Nasopharyngeal Swab     Status: None   Collection Time: 11/17/19  2:51 AM   Specimen:  Nasopharyngeal Swab  Result Value Ref Range Status   SARS Coronavirus 2 NEGATIVE NEGATIVE Final    Comment: (NOTE) SARS-CoV-2 target nucleic acids are NOT DETECTED. The SARS-CoV-2 RNA is generally detectable in upper and lower respiratory specimens during the acute phase of infection. Negative results do not preclude SARS-CoV-2 infection, do not rule out co-infections with other pathogens, and should not be used as the sole basis for treatment or other patient management decisions. Negative results must be combined with clinical observations, patient history, and epidemiological information. The expected result is Negative. Fact Sheet for Patients: SugarRoll.be Fact  Sheet for Healthcare Providers: https://www.woods-mathews.com/ This test is not yet approved or cleared by the [place] FDA and  has been authorized for detection and/or diagnosis of SARS-CoV-2 by FDA under an Emergency Use Authorization (EUA). This EUA will remain  in effect (meaning this test can be used) for the duration of the COVID-19 declaration under Section 56 4(b)(1) of the Act, 21 U.S.C. section 360bbb-3(b)(1), unless the authorization is terminated or revoked sooner. Performed at [facility], [place] [address], [place], [place] [address]      Time coordinating discharge: Over 30 minutes  SIGNED:   Darliss Cheney, MD  [facility] 11/17/2019, 2:08 PM  If 7PM-7AM, please contact night-coverage [email or web address] Password [email or web address]

## 2019-11-17 NOTE — ED Triage Notes (Addendum)
Per [facility] pt presents to ED for possible seizure like activity. Syncopal episodes x3 years, possible seizure activity x5 months has not been evaluated or dignosed for possible seizure, seen here last night and d/c from ED approx 1530. Pt had some "twitching" upon EMS arrival but a/o x4, answering questions during her "twitching" event.   Pt states she has returned today for another "seizure"   99/63 80 100% RA CBG 119  20g LAC

## 2019-11-18 ENCOUNTER — Encounter (HOSPITAL_COMMUNITY): Payer: Self-pay | Admitting: Emergency Medicine

## 2019-11-18 ENCOUNTER — Other Ambulatory Visit: Payer: Self-pay

## 2019-11-18 ENCOUNTER — Observation Stay (HOSPITAL_COMMUNITY)
Admission: EM | Admit: 2019-11-18 | Discharge: 2019-11-20 | Disposition: A | Discharge status: Home / Self Care | Payer: BC Managed Care – PPO | Attending: Internal Medicine | Admitting: Internal Medicine

## 2019-11-18 DIAGNOSIS — J45909 Unspecified asthma, uncomplicated: Secondary | ICD-10-CM | POA: Diagnosis not present

## 2019-11-18 DIAGNOSIS — Z20828 Contact with and (suspected) exposure to other viral communicable diseases: Secondary | ICD-10-CM | POA: Diagnosis not present

## 2019-11-18 DIAGNOSIS — R569 Unspecified convulsions: Principal | ICD-10-CM

## 2019-11-18 DIAGNOSIS — R000 Tachycardia, unspecified: Secondary | ICD-10-CM | POA: Diagnosis not present

## 2019-11-18 DIAGNOSIS — R110 Nausea: Secondary | ICD-10-CM | POA: Diagnosis not present

## 2019-11-18 DIAGNOSIS — G40909 Epilepsy, unspecified, not intractable, without status epilepticus: Secondary | ICD-10-CM | POA: Diagnosis not present

## 2019-11-18 DIAGNOSIS — Z23 Encounter for immunization: Secondary | ICD-10-CM | POA: Insufficient documentation

## 2019-11-18 DIAGNOSIS — Z03818 Encounter for observation for suspected exposure to other biological agents ruled out: Secondary | ICD-10-CM | POA: Diagnosis not present

## 2019-11-18 DIAGNOSIS — G4489 Other headache syndrome: Secondary | ICD-10-CM | POA: Diagnosis not present

## 2019-11-18 LAB — BASIC METABOLIC PANEL
Anion gap: 8 (ref 5–15)
BUN: 15 mg/dL (ref 6–20)
CO2: 24 mmol/L (ref 22–32)
Calcium: 8.9 mg/dL (ref 8.9–10.3)
Chloride: 107 mmol/L (ref 98–111)
Creatinine, Ser: 0.69 mg/dL (ref 0.44–1.00)
GFR calc Af Amer: >60 mL/min (ref >60)
GFR calc non Af Amer: >60 mL/min (ref >60)
Glucose, Bld: 114 mg/dL — ABNORMAL HIGH (ref 70–99)
Potassium: 3.8 mmol/L (ref 3.5–5.1)
Sodium: 139 mmol/L (ref 135–145)

## 2019-11-18 LAB — I-STAT BETA HCG BLOOD, ED (MC, WL, AP ONLY): I-stat hCG, quantitative: <5 m[IU]/mL (ref <5)

## 2019-11-18 LAB — URINALYSIS, ROUTINE W REFLEX MICROSCOPIC
Bilirubin Urine: NEGATIVE
Glucose, UA: NEGATIVE mg/dL
Hgb urine dipstick: NEGATIVE
Ketones, ur: 5 mg/dL — AB
Leukocytes,Ua: NEGATIVE
Nitrite: NEGATIVE
Protein, ur: NEGATIVE mg/dL
Specific Gravity, Urine: 1.026 (ref 1.005–1.030)
pH: 6 (ref 5.0–8.0)

## 2019-11-18 LAB — CBC
HCT: 35.2 % — ABNORMAL LOW (ref 36.0–46.0)
Hemoglobin: 12.4 g/dL (ref 12.0–15.0)
MCH: 31.6 pg (ref 26.0–34.0)
MCHC: 35.2 g/dL (ref 30.0–36.0)
MCV: 89.8 fL (ref 80.0–100.0)
Platelets: 264 10*3/uL (ref 150–400)
RBC: 3.92 MIL/uL (ref 3.87–5.11)
RDW: 11.4 % — ABNORMAL LOW (ref 11.5–15.5)
WBC: 5.4 10*3/uL (ref 4.0–10.5)
nRBC: 0 % (ref 0.0–0.2)

## 2019-11-18 LAB — HEAVY METALS, BLOOD

## 2019-11-18 LAB — PARATHYROID HORMONE, INTACT (NO CA): PTH: 25 pg/mL (ref 15–65)

## 2019-11-18 LAB — CBG MONITORING, ED: Glucose-Capillary: 102 mg/dL — ABNORMAL HIGH (ref 70–99)

## 2019-11-18 LAB — CERULOPLASMIN: Ceruloplasmin: 19.6 mg/dL (ref 19.0–39.0)

## 2019-11-18 NOTE — ED Triage Notes (Signed)
Patient arrived with EMS reports multiple episodes of seizures today , last seizure this evening approx. 1-2 mins witnessed by a friend , alert and oriented at arrival with mild headache , denies pain/respirations unlabored. She has not seen a neurologist .

## 2019-11-18 NOTE — ED Notes (Signed)
Pts girlfriend "emergency contact" called to check on the pt

## 2019-11-18 NOTE — Consult Note (Signed)
NEURO HOSPITALIST CONSULT NOTE   Requestig physician: Dr. Roxanne Mins  Reason for Consult: Recurrent seizure-like spells  History obtained from:  Patient and Chart     HPI:                                                                                                                                          Natalie Simmons is an 18 y.o. female re-presenting with seizure like spells x 2. The first occurred while she was at home with her girlfriend. She had initial symptom of rigidity in all 4 extremities, after which she cannot recall any more information. Her GF saw the spell and told patient afterwards that she had been shaking all over with her eyes rolled back in her head. She was not told the duration of the spell. She had a second spell afterwards. Did not bite her tongue or experience B/B incontinence with her spells.  In addition to the above, the patient complains of a "1000/10" headache, blurred vision and BLE weakness with numbness. She also complains of vertigo with presyncope. No lateralized vision loss, dysphagia, dysarthria, dysphasia or upper extremity weakness. Comprehensive ROS otherwise negative.   She was seen by the Neurology service on 12/20 for c/c of syncopal episodes and uncontrollable abnormal movements over the past 3 years, with worsening frequency over the past few weeks. Per Dr. Johny Chess note: "She describes that 3 years ago she started having these events where she would feel her heart fluttering and then would pass out for 20 seconds to a minute and then come around, be confused for about a minute or so and then return to a normal baseline.  At that time she was evaluated by her primary care as well as cardiology.  Underwent echocardiogram and Holter monitor testing with no clear explanation.  She continued to have these episodes, which were also followed by whole body shaking witnessed at times by bystanders which included her friends or roommates but over  the past 5 weeks she has been having these with more increasing frequency and is now having uncontrollable shaking movements of her legs way past the time from the passing out episode." Results of work up for that admission: Urine drug screen positive for THC,  low serumiron level at 21,calcium borderline at 8.8, mildly low potassium at 3.3, Normal magnesium, sodium levels. Serum copper, ceruloplasmin, heavy metals were pending. HIV: non reactive. Vitamin B12:, ESR CRP normal, ferritin normal TSH normal, pending parathyroid hormone  MRI brain and routine EEG were normal. Plan at discharge was for outpatient neurology evaluation and possible ambulatory EEG as well as possible 30 day cardiac monitor.    Past Medical History:  Diagnosis Date  . Asthma   . Seizures (HCC)   Migraine headaches  History reviewed.  No pertinent surgical history.  Family History  Problem Relation Age of Onset  . Drug abuse Mother   . Drug abuse Father               Social History:  reports that she has never smoked. She has never used smokeless tobacco. She reports that she does not drink alcohol or use drugs.  Allergies  Allergen Reactions  . Latex Itching    HOME MEDICATIONS:                                                                                                                      No current facility-administered medications on file prior to encounter.   Current Outpatient Medications on File Prior to Encounter  Medication Sig Dispense Refill  . albuterol (VENTOLIN HFA) 108 (90 Base) MCG/ACT inhaler Inhale 2 puffs into the lungs every 6 (six) hours as needed for wheezing or shortness of breath.    . Ibuprofen (ADVIL) 200 MG CAPS Take 400-800 mg by mouth every 8 (eight) hours as needed (for moderate to severe headaches).    . Multiple Vitamins-Calcium (ONE-A-DAY WOMENS FORMULA) TABS Take 1 tablet by mouth daily with breakfast.       ROS:                                                                                                                                        As per HPI.    Blood pressure 102/60, pulse 77, temperature 99.1 F (37.3 C), temperature source Oral, resp. rate (!) 23, last menstrual period 11/17/2019, SpO2 100 %.   General Examination:                                                                                                       Physical Exam  HEENT-  [place]/AT   Lungs- Respirations unlabored Extremities- No edema  Neurological Examination Mental Status: Alert, oriented, thought content appropriate.  Speech fluent without evidence of aphasia.  Able to follow all commands without difficulty. Cranial Nerves: II: Visual fields intact bilaterally. PERRL  III,IV, VI: Ptosis not present, EOMI. No nystagmus.  V,VII: Smile asymmetric with decreased size of labial fissure on the left, however cheek puff normal and moves lower face equally during speech. Temp sensation equal bilaterally.  VIII: hearing intact to conversation.  IX,X: Palate rises symmetrically XI: Symmetric shoulder shrug XII: Midline tongue extension Motor: BUE 5/5 with no drift BLE with tremors on knee extension and variable giveway weakness bilaterally. Max strength with HF is 5/5 and with KE 4+/5 bilaterally.  Sensory: Temp and light touch intact BUE and BLE. No extinction Deep Tendon Reflexes: 2+ and symmetric throughout Cerebellar: No ataxia with FNF bilaterally  Gait: Deferred   Lab Results: Basic Metabolic Panel: Recent Labs  Lab 11/17/19 0005 11/17/19 0446 11/18/19 2215  NA 139 139 139  K 3.3* 3.5 3.8  CL 107 107 107  CO2 '24 22 24  ' GLUCOSE 122* 95 114*  BUN '15 13 15  ' CREATININE 0.76 0.72 0.69  CALCIUM 9.3 8.8* 8.9  MG  --  2.1  --     CBC: Recent Labs  Lab 11/17/19 0005 11/17/19 0446 11/18/19 2215  WBC 6.2 4.9 5.4  NEUTROABS  --  3.2  --   HGB 13.7 12.8 12.4  HCT 39.1 36.9 35.2*  MCV 90.9 90.4 89.8  PLT 272 255 264    Cardiac Enzymes: Recent Labs  Lab  11/17/19 0240  CKTOTAL 58  CKMB 1.0    Lipid Panel: No results for input(s): CHOL, TRIG, HDL, CHOLHDL, VLDL, LDLCALC in the last 168 hours.  Imaging: MR Brain W and Wo Contrast  Result Date: 11/17/2019 CLINICAL DATA:  Initial evaluation for acute seizure. EXAM: MRI HEAD WITHOUT AND WITH CONTRAST TECHNIQUE: Multiplanar, multiecho pulse sequences of the brain and surrounding structures were obtained without and with intravenous contrast. CONTRAST:  26m GADAVIST GADOBUTROL 1 MMOL/ML IV SOLN COMPARISON:  None. FINDINGS: Brain: Cerebral volume within normal limits for patient age. No focal parenchymal signal abnormality identified. No abnormal foci of restricted diffusion to suggest acute or subacute ischemia. Gray-white matter differentiation well maintained. No encephalomalacia to suggest chronic infarction. No foci of susceptibility artifact to suggest acute or chronic intracranial hemorrhage. No mass lesion, midline shift or mass effect. No hydrocephalus. No extra-axial fluid collection. Major dural sinuses are grossly patent. No intrinsic temporal lobe abnormality. Pituitary gland and suprasellar region are normal. Midline structures intact and normal. No abnormal enhancement. Vascular: Major intracranial vascular flow voids well maintained and normal in appearance. Skull and upper cervical spine: Craniocervical junction normal. Visualized upper cervical spine within normal limits. Bone marrow signal intensity normal. No scalp soft tissue abnormality. Sinuses/Orbits: Globes and orbital soft tissues within normal limits. Paranasal sinuses are clear. No mastoid effusion. Inner ear structures normal. Other: None. IMPRESSION: Normal brain MRI.  No acute intracranial abnormality identified. Electronically Signed   By: BJeannine BogaM.D.   On: 11/17/2019 04:54   EEG adult  Result Date: 11/17/2019 RAlexis Goodell MD     11/17/2019 10:30 AM ELECTROENCEPHALOGRAM REPORT Patient: MStassi Simmons       Room #: 038C EEG No. ID: [id number] Age: 18y.o.        Sex: female Referring Physician: Pahwani Report Date:  11/17/2019       Interpreting Physician: RAlexis GoodellHistory: MAdalyne Lovickis an 18y.o. female with shaking episodes Medications: None Conditions of Recording:  This is a 21 channel routine scalp  EEG performed with bipolar and monopolar montages arranged in accordance to the international 10/20 system of electrode placement. One channel was dedicated to EKG recording. The patient is in the awake, drowsy and asleep states. Description:  The waking background activity consists of a low voltage, symmetrical, fairly well organized, 11 Hz alpha activity, seen from the parieto-occipital and posterior temporal regions.  Low voltage fast activity, poorly organized, is seen anteriorly and is at times superimposed on more posterior regions.  A mixture of theta and alpha rhythms are seen from the central and temporal regions. The patient drowses with slowing to irregular, low voltage theta and beta activity.  The patient goes in to a light sleep with symmetrical sleep spindles, vertex central sharp transients and irregular slow activity. No epileptiform activity is noted.  Hyperventilation was performed and produced a mild to moderate buildup but failed to elicit any abnormalities.  Intermittent photic stimulation was performed but failed to illicit any change in the tracing.  IMPRESSION: Normal electroencephalogram, awake, asleep and with activation procedures. There are no focal lateralizing or epileptiform features. Alexis Goodell, MD Neurology [phone] 11/17/2019, 10:29 AM    Assessment: 18 year old female re-presenting with seizure-like spells x 2 after initial assessment on 12/20 for movement disorder 1. Exam is nonfocal. BLE tremor is elicited by motor testing but not present with distraction. Also has variable giveway weakness with testing of quadriceps strength bilaterally.    Recommendations: 1. LTM EEG in the AM.  2. Ativan 2 mg IV PRN seizure lasting > 3 minutes.   Electronically signed: Dr. Kerney Elbe 11/18/2019, 11:20 PM

## 2019-11-18 NOTE — ED Notes (Signed)
[phone] Marcie Mowers family friend of pt, states pt has been here various times for same reason. Would like to speak with person of care with permission of the pt.   Ask that pt receives a doctors note upon discharge for her work

## 2019-11-18 NOTE — ED Provider Notes (Signed)
[facility] EMERGENCY DEPARTMENT Provider Note   CSN: [id number] Arrival date & time: 11/18/19  2136   History Chief Complaint  Patient presents with  . Seizures    Multiple Episodes Today    Natalie Simmons is a 18 y.o. female.  The history is provided by the patient.  Seizures She has history of asthma and was just admitted to the hospital with seizure-like activity and states that she has had several additional episodes since discharge.  She states that she feels funny and then things go black and she is told that she has shaking episodes while she is unaware.  There was one episode of bit lip but no bit tongue.  She has not had any incontinence.  She was discharged from the hospital on no medications and recommended follow-up with neurology but cannot get a neurology appointment until February.  Past Medical History:  Diagnosis Date  . Asthma   . Seizures [facility])     Patient Active Problem List   Diagnosis Date Noted  . Seizure-like activity (HCC) 11/17/2019  . Hypokalemia 11/17/2019    History reviewed. No pertinent surgical history.   OB History   No obstetric history on file.     Family History  Problem Relation Age of Onset  . Drug abuse Mother   . Drug abuse Father     Social History   Tobacco Use  . Smoking status: Never Smoker  . Smokeless tobacco: Never Used  Substance Use Topics  . Alcohol use: No  . Drug use: No    Home Medications Prior to Admission medications   Medication Sig Start Date End Date Taking? Authorizing Provider  albuterol (VENTOLIN HFA) 108 (90 Base) MCG/ACT inhaler Inhale 2 puffs into the lungs every 6 (six) hours as needed for wheezing or shortness of breath.    [provider]  Ibuprofen (ADVIL) 200 MG CAPS Take 400-800 mg by mouth every 8 (eight) hours as needed (for moderate to severe headaches).    [provider]  Multiple Vitamins-Calcium (ONE-A-DAY WOMENS FORMULA) TABS Take 1 tablet by  mouth daily with breakfast.    [provider]    Allergies    Latex  Review of Systems   Review of Systems  Neurological: Positive for seizures.  All other systems reviewed and are negative.   Physical Exam Updated Vital Signs BP 102/60   Pulse 77   Temp 99.1 F (37.3 C) (Oral)   Resp (!) 23   LMP 11/17/2019   SpO2 100%   Physical Exam Vitals and nursing note reviewed.   18 year old female, resting comfortably and in no acute distress. Vital signs are significant for elevated respiratory rate. Oxygen saturation is 100%, which is normal. Head is normocephalic and atraumatic. PERRLA, EOMI. Oropharynx is clear. Neck is nontender and supple without adenopathy or JVD. Back is nontender and there is no CVA tenderness. Lungs are clear without rales, wheezes, or rhonchi. Chest is nontender. Heart has regular rate and rhythm without murmur. Abdomen is soft, flat, nontender without masses or hepatosplenomegaly and peristalsis is normoactive. Extremities have no cyanosis or edema, full range of motion is present. Skin is warm and dry without rash. Neurologic: Mental status is normal, cranial nerves are intact, there are no motor or sensory deficits.  There is no pronator drift.  Finger-nose testing is normal.  ED Results / Procedures / Treatments   Labs (all labs ordered are listed, but only abnormal results are displayed) Labs Reviewed  BASIC METABOLIC PANEL - Abnormal; Notable for the following components:      Result Value   Glucose, Bld 114 (*)    All other components within normal limits  CBC - Abnormal; Notable for the following components:   HCT 35.2 (*)    RDW 11.4 (*)    All other components within normal limits  URINALYSIS, ROUTINE W REFLEX MICROSCOPIC - Abnormal; Notable for the following components:   Ketones, ur 5 (*)    All other components within normal limits  CBG MONITORING, ED - Abnormal; Notable for the following components:   Glucose-Capillary  102 (*)    All other components within normal limits  I-STAT BETA HCG BLOOD, ED ([facility], [facility], [facility] ONLY)    EKG EKG Interpretation  Date/Time:  Monday November 18 2019 22:45:06 EST Ventricular Rate:  92 PR Interval:    QRS Duration: 90 QT Interval:  343 QTC Calculation: 425 R Axis:   86 Text Interpretation: Sinus rhythm Rightward axis When compared with ECG of EARLIER SAME DATE No significant change was found Confirmed by Delora Fuel ([id number]) on 11/18/2019 11:02:39 PM   Radiology MR Brain W and Wo Contrast  Result Date: 11/17/2019 CLINICAL DATA:  Initial evaluation for acute seizure. EXAM: MRI HEAD WITHOUT AND WITH CONTRAST TECHNIQUE: Multiplanar, multiecho pulse sequences of the brain and surrounding structures were obtained without and with intravenous contrast. CONTRAST:  49mL GADAVIST GADOBUTROL 1 MMOL/ML IV SOLN COMPARISON:  None. FINDINGS: Brain: Cerebral volume within normal limits for patient age. No focal parenchymal signal abnormality identified. No abnormal foci of restricted diffusion to suggest acute or subacute ischemia. Gray-white matter differentiation well maintained. No encephalomalacia to suggest chronic infarction. No foci of susceptibility artifact to suggest acute or chronic intracranial hemorrhage. No mass lesion, midline shift or mass effect. No hydrocephalus. No extra-axial fluid collection. Major dural sinuses are grossly patent. No intrinsic temporal lobe abnormality. Pituitary gland and suprasellar region are normal. Midline structures intact and normal. No abnormal enhancement. Vascular: Major intracranial vascular flow voids well maintained and normal in appearance. Skull and upper cervical spine: Craniocervical junction normal. Visualized upper cervical spine within normal limits. Bone marrow signal intensity normal. No scalp soft tissue abnormality. Sinuses/Orbits: Globes and orbital soft tissues within normal limits. Paranasal sinuses are clear. No mastoid effusion.  Inner ear structures normal. Other: None. IMPRESSION: Normal brain MRI.  No acute intracranial abnormality identified. Electronically Signed   By: Jeannine Boga M.D.   On: 11/17/2019 04:54   EEG adult  Result Date: 11/17/2019 Alexis Goodell, MD     11/17/2019 10:30 AM ELECTROENCEPHALOGRAM REPORT Patient: Kemiya Batdorf       Room #: 038C EEG No. ID: [id number] Age: 18 y.o.        Sex: female Referring Physician: Pahwani Report Date:  11/17/2019       Interpreting Physician: Alexis Goodell History: Jacy Brocker is an 18 y.o. female with shaking episodes Medications: None Conditions of Recording:  This is a 21 channel routine scalp EEG performed with bipolar and monopolar montages arranged in accordance to the international 10/20 system of electrode placement. One channel was dedicated to EKG recording. The patient is in the awake, drowsy and asleep states. Description:  The waking background activity consists of a low voltage, symmetrical, fairly well organized, 11 Hz alpha activity, seen from the parieto-occipital and posterior temporal regions.  Low voltage fast activity, poorly organized, is seen anteriorly and is at times superimposed on more posterior regions.  A mixture of theta  and alpha rhythms are seen from the central and temporal regions. The patient drowses with slowing to irregular, low voltage theta and beta activity.  The patient goes in to a light sleep with symmetrical sleep spindles, vertex central sharp transients and irregular slow activity. No epileptiform activity is noted.  Hyperventilation was performed and produced a mild to moderate buildup but failed to elicit any abnormalities.  Intermittent photic stimulation was performed but failed to illicit any change in the tracing.  IMPRESSION: Normal electroencephalogram, awake, asleep and with activation procedures. There are no focal lateralizing or epileptiform features. Thana FarrLeslie Reynolds, MD Neurology [phone] 11/17/2019, 10:29  AM    Procedures Procedures  Medications Ordered in ED Medications - No data to display  ED Course  I have reviewed the triage vital signs and the nursing notes.  Pertinent lab results that were available during my care of the patient were reviewed by me and considered in my medical decision making (see chart for details).  MDM Rules/Calculators/A&P                      Seizure-like activity.  Old records reviewed and she was just discharged from the hospital at about 2 PM.  Hospital work-up included normal MRI of the brain and normal EEG.  She had been given lorazepam and valproic acid and stated that she felt better while she was on those medicines, but she was discharged off of those medications with intention of getting either ambulatory EEG or inpatient EEG monitoring.  Nursing note describes what was felt to be a pseudoseizure while she was in the waiting room, but I did not witness this.  I have discussed the case with Dr. Otelia LimesLindzen of neurology service who will evaluate the patient.  Consult note appreciated.  Recommendation is for LTM EEG, use lorazepam as needed for prolonged seizure.  We will admit observation status to try to get LTM EEG arranged.  Case is discussed with Dr. Toniann FailKakrakandy of [facility] who agrees to admit the patient.  Final Clinical Impression(s) / ED Diagnoses Final diagnoses:  Seizure-like activity [facility](HCC)    Rx / DC Orders ED Discharge Orders    None       Dione BoozeGlick, Aunika Kirsten, MD 11/19/19 0330

## 2019-11-18 NOTE — ED Notes (Signed)
Pt began to have pseudoseizure in the lobby, pt placed on stretcher and brought back to the resus room, pt AxO x 4, moves all extremities well, no postictal state, no incontinence of bowel or bladder, pt continues to shake her body and states that "she cant help it"

## 2019-11-19 ENCOUNTER — Encounter (HOSPITAL_COMMUNITY): Payer: Self-pay | Admitting: Internal Medicine

## 2019-11-19 ENCOUNTER — Observation Stay (HOSPITAL_COMMUNITY): Payer: BC Managed Care – PPO

## 2019-11-19 DIAGNOSIS — R569 Unspecified convulsions: Secondary | ICD-10-CM | POA: Diagnosis not present

## 2019-11-19 LAB — SARS CORONAVIRUS 2 (TAT 6-24 HRS): SARS Coronavirus 2: NEGATIVE

## 2019-11-19 MED ORDER — ACETAMINOPHEN 650 MG RE SUPP: 650.0000 mg | Freq: Four times a day (QID) | RECTAL | Status: DC | PRN

## 2019-11-19 MED ORDER — LORAZEPAM 2 MG/ML IJ SOLN: 1.0000 mg | INTRAMUSCULAR | Status: DC | PRN

## 2019-11-19 MED ORDER — ACETAMINOPHEN 325 MG PO TABS
650.0000 mg | ORAL_TABLET | Freq: Four times a day (QID) | ORAL | Status: DC | PRN
  Administered 2019-11-19: 13:00:00 650 mg via ORAL
  Filled 2019-11-19: qty 2

## 2019-11-19 MED ORDER — ALBUTEROL SULFATE (2.5 MG/3ML) 0.083% IN NEBU: 2.5000 mg | INHALATION_SOLUTION | Freq: Four times a day (QID) | RESPIRATORY_TRACT | Status: DC | PRN

## 2019-11-19 MED ORDER — INFLUENZA VAC SPLIT QUAD 0.5 ML IM SUSY
0.5000 mL | PREFILLED_SYRINGE | INTRAMUSCULAR | Status: AC
  Administered 2019-11-20: 0.5 mL via INTRAMUSCULAR
  Filled 2019-11-19: qty 0.5

## 2019-11-19 MED ORDER — IBUPROFEN 200 MG PO TABS: 400.0000 mg | ORAL_TABLET | Freq: Four times a day (QID) | ORAL | Status: DC | PRN

## 2019-11-19 MED ORDER — ENSURE ENLIVE PO LIQD
237.0000 mL | Freq: Two times a day (BID) | ORAL | Status: DC
  Administered 2019-11-19 – 2019-11-20 (×2): 237 mL via ORAL

## 2019-11-19 MED ORDER — ONDANSETRON HCL 4 MG/2ML IJ SOLN
4.0000 mg | Freq: Four times a day (QID) | INTRAMUSCULAR | Status: DC | PRN
  Administered 2019-11-19: 13:00:00 4 mg via INTRAVENOUS
  Filled 2019-11-19: qty 2

## 2019-11-19 MED ORDER — LORAZEPAM 2 MG/ML IJ SOLN: 2.0000 mg | INTRAMUSCULAR | Status: DC | PRN

## 2019-11-19 MED ORDER — ONDANSETRON HCL 4 MG PO TABS: 4.0000 mg | ORAL_TABLET | Freq: Four times a day (QID) | ORAL | Status: DC | PRN

## 2019-11-19 MED ORDER — ENOXAPARIN SODIUM 40 MG/0.4ML ~~LOC~~ SOLN
40.0000 mg | Freq: Every day | SUBCUTANEOUS | Status: DC
  Administered 2019-11-19: 10:00:00 40 mg via SUBCUTANEOUS
  Filled 2019-11-19 (×2): qty 0.4

## 2019-11-19 NOTE — Progress Notes (Signed)
Same day note  Patient seen and examined at bedside.  Patient was admitted to the hospital for seizure-like activity.  At the time of my evaluation, patient complains of feeling okay.  Denies any headache, nausea or dizziness.  Physical examination reveals a young female with no focal neurological deficit.  Laboratory data and imaging was reviewed over the last 24 hours  Assessment and Plan.  Seizure-like activity.  Recent admission including EEG and MRI for seizure..  Neurology was consulted and recommended video EEG and overnight observation..  Will follow neurology recommendation.  No Charge  Signed,  Delila Pereyra, MD [facility]

## 2019-11-19 NOTE — ED Notes (Signed)
Attempted to give reportx1 

## 2019-11-19 NOTE — Progress Notes (Signed)
Called into room by patient's visitor. Found patient with having jerking and twitching form head to toe. Does not respond to verbal stimuli. Had 3 episodes within 9 minutes. Airway maintain and neurologist called at beside at 1500.

## 2019-11-19 NOTE — Progress Notes (Signed)
LTM EEG hooked up and running - no initial skin breakdown - push button tested - neuro notified.  

## 2019-11-19 NOTE — H&P (Signed)
History and Physical    Carma Dwiggins FOY:[id number] DOB: Dec 22, 2000 DOA: 11/18/2019  PCP: Joette Catching, MD  Patient coming from: Home.  Chief Complaint: Seizures.  HPI: Natalie Simmons is a 18 y.o. female with history of asthma who was admitted and discharged yesterday after being observed for seizure-like activity at that time patient had extensive work-up including EEGs and MRI brain unremarkable.  Patient was discharged home on no antiepileptics.  Patient was reported to have 2 generalized tonic-clonic seizure-like activity back-to-back witnessed by patient's girlfriend.  This happened while at home watching TV.  No incontinence of urine or tongue biting during the episode.  Patient had severe headache and weakness of the lower extremity following the episode.  Was mildly confused per the patient after the episode.  ED Course: In the ER patient appeared nonfocal.  Patient was afebrile.  No neck rigidity on exam.  Labs were unremarkable.  COVID-19 test is pending.  Neurology on-call was consulted and admitted for further observation and continuous EEG monitoring.  Review of Systems: As per HPI, rest all negative.   Past Medical History:  Diagnosis Date  . Asthma   . Seizures (HCC)     History reviewed. No pertinent surgical history.   reports that she has never smoked. She has never used smokeless tobacco. She reports that she does not drink alcohol or use drugs.  Allergies  Allergen Reactions  . Latex Itching    Family History  Problem Relation Age of Onset  . Drug abuse Mother   . Drug abuse Father     Prior to Admission medications   Medication Sig Start Date End Date Taking? Authorizing Provider  albuterol (VENTOLIN HFA) 108 (90 Base) MCG/ACT inhaler Inhale 2 puffs into the lungs every 6 (six) hours as needed for wheezing or shortness of breath.    [provider]  Ibuprofen (ADVIL) 200 MG CAPS Take 400-800 mg by mouth every 8 (eight) hours as needed (for  moderate to severe headaches).    [provider]  Multiple Vitamins-Calcium (ONE-A-DAY WOMENS FORMULA) TABS Take 1 tablet by mouth daily with breakfast.    [provider]    Physical Exam: Constitutional: Moderately built and nourished. Vitals:   11/18/19 2300 11/18/19 2315 11/18/19 2330 11/18/19 2345  BP: 96/63 (!) 99/59 (!) 97/59   Pulse: 65 73 81 74  Resp: 15 (!) 21 16 (!) 25  Temp:      TempSrc:      SpO2: 98% 100% 100% 98%   Eyes: Anicteric no pallor. ENMT: No discharge from the ears eyes nose or mouth. Neck: No mass felt.  No neck rigidity. Respiratory: No rhonchi or crepitations. Cardiovascular: S1-S2 heard. Abdomen: Soft nontender bowel sounds present. Musculoskeletal: No edema.  No joint effusion. Skin: No rash. Neurologic: Alert awake oriented to time place and person.  Moves all extremities. Psychiatric: Appears normal per normal affect.   Labs on Admission: I have personally reviewed following labs and imaging studies  CBC: Recent Labs  Lab 11/17/19 0005 11/17/19 0446 11/18/19 2215  WBC 6.2 4.9 5.4  NEUTROABS  --  3.2  --   HGB 13.7 12.8 12.4  HCT 39.1 36.9 35.2*  MCV 90.9 90.4 89.8  PLT 272 255 264   Basic Metabolic Panel: Recent Labs  Lab 11/17/19 0005 11/17/19 0446 11/18/19 2215  NA 139 139 139  K 3.3* 3.5 3.8  CL 107 107 107  CO2 24 22 24   GLUCOSE 122* 95 114*  BUN 15  13 15  CREATININE 0.76 0.72 0.69  CALCIUM 9.3 8.8* 8.9  MG  --  2.1  --    GFR: Estimated Creatinine Clearance: 87.5 mL/min (by C-G formula based on SCr of 0.69 mg/dL). Liver Function Tests: Recent Labs  Lab 11/17/19 0005 11/17/19 0446  AST 18 19  ALT 12 13  ALKPHOS 46 42  BILITOT 0.8 0.7  PROT 7.2 6.7  ALBUMIN 4.3 4.0   No results for input(s): LIPASE, AMYLASE in the last 168 hours. No results for input(s): AMMONIA in the last 168 hours. Coagulation Profile: No results for input(s): INR, PROTIME in the last 168 hours. Cardiac  Enzymes: Recent Labs  Lab 11/17/19 0240  CKTOTAL 58  CKMB 1.0   BNP (last 3 results) No results for input(s): PROBNP in the last 8760 hours. HbA1C: No results for input(s): HGBA1C in the last 72 hours. CBG: Recent Labs  Lab 11/18/19 2221  GLUCAP 102*   Lipid Profile: No results for input(s): CHOL, HDL, LDLCALC, TRIG, CHOLHDL, LDLDIRECT in the last 72 hours. Thyroid Function Tests: Recent Labs    11/17/19 0446  TSH 2.605   Anemia Panel: Recent Labs    11/17/19 0446  VITAMINB12 335  FERRITIN 32  TIBC 276  IRON 21*   Urine analysis:    Component Value Date/Time   COLORURINE YELLOW 11/18/2019 2219   [place] 11/18/2019 2219   LABSPEC 1.026 11/18/2019 2219   PHURINE 6.0 11/18/2019 2219   GLUCOSEU NEGATIVE 11/18/2019 2219   HGBUR NEGATIVE 11/18/2019 2219   [place] NEGATIVE 11/18/2019 2219   KETONESUR 5 (A) 11/18/2019 2219   PROTEINUR NEGATIVE 11/18/2019 2219   NITRITE NEGATIVE 11/18/2019 2219   LEUKOCYTESUR NEGATIVE 11/18/2019 2219   Sepsis Labs: @LABRCNTIP (procalcitonin:4,lacticidven:4) ) Recent Results (from the past 240 hour(s))  SARS CORONAVIRUS 2 (TAT 6-24 HRS) Nasopharyngeal Nasopharyngeal Swab     Status: None   Collection Time: 11/17/19  2:51 AM   Specimen: Nasopharyngeal Swab  Result Value Ref Range Status   SARS Coronavirus 2 NEGATIVE NEGATIVE Final    Comment: (NOTE) SARS-CoV-2 target nucleic acids are NOT DETECTED. The SARS-CoV-2 RNA is generally detectable in upper and lower respiratory specimens during the acute phase of infection. Negative results do not preclude SARS-CoV-2 infection, do not rule out co-infections with other pathogens, and should not be used as the sole basis for treatment or other patient management decisions. Negative results must be combined with clinical observations, patient history, and epidemiological information. The expected result is Negative. Fact Sheet for  Patients: SugarRoll.be Fact Sheet for Healthcare Providers: https://www.woods-mathews.com/ This test is not yet approved or cleared by the [place] FDA and  has been authorized for detection and/or diagnosis of SARS-CoV-2 by FDA under an Emergency Use Authorization (EUA). This EUA will remain  in effect (meaning this test can be used) for the duration of the COVID-19 declaration under Section 56 4(b)(1) of the Act, 21 U.S.C. section 360bbb-3(b)(1), unless the authorization is terminated or revoked sooner. Performed at [facility], [place] [address] [place], [place] [address]      Radiological Exams on Admission: EEG adult  Result Date: 11/17/2019 Alexis Goodell, MD     11/17/2019 10:30 AM ELECTROENCEPHALOGRAM REPORT Patient: Natalie Simmons       Room #: 038C EEG No. ID: [id number] Age: 18 y.o.        Sex: female Referring Physician: Pahwani Report Date:  11/17/2019       Interpreting Physician: Alexis Goodell History: Tien Aispuro is  an 18 y.o. female with shaking episodes Medications: None Conditions of Recording:  This is a 21 channel routine scalp EEG performed with bipolar and monopolar montages arranged in accordance to the international 10/20 system of electrode placement. One channel was dedicated to EKG recording. The patient is in the awake, drowsy and asleep states. Description:  The waking background activity consists of a low voltage, symmetrical, fairly well organized, 11 Hz alpha activity, seen from the parieto-occipital and posterior temporal regions.  Low voltage fast activity, poorly organized, is seen anteriorly and is at times superimposed on more posterior regions.  A mixture of theta and alpha rhythms are seen from the central and temporal regions. The patient drowses with slowing to irregular, low voltage theta and beta activity.  The patient goes in to a light sleep with symmetrical sleep spindles, vertex central sharp  transients and irregular slow activity. No epileptiform activity is noted.  Hyperventilation was performed and produced a mild to moderate buildup but failed to elicit any abnormalities.  Intermittent photic stimulation was performed but failed to illicit any change in the tracing.  IMPRESSION: Normal electroencephalogram, awake, asleep and with activation procedures. There are no focal lateralizing or epileptiform features. Thana FarrLeslie Reynolds, MD Neurology [phone] 11/17/2019, 10:29 AM     Assessment/Plan Principal Problem:   Seizure-like activity (HCC)    1. Seizure-like activity -appreciate neurology consult.  Patient admitted for continuous EEG monitoring.  As needed Ativan for seizure-like activity. 2. History of asthma presently not wheezing.  COVID-19 test is pending.   DVT prophylaxis: Lovenox. Code Status: Full code. Family Communication: Discussed with patient. Disposition Plan: Home. Consults called: Neurologist. Admission status: Observation.   Eduard ClosArshad N Ridge Lafond MD [facility] Pager [phone].  If 7PM-7AM, please contact night-coverage [email or web address] Password [email or web address]  11/19/2019, 4:45 AM

## 2019-11-19 NOTE — Progress Notes (Addendum)
Reason for consult: Seizure-like episodes  Subjective: No acute events overnight.  On LTM EEG currently.  States she is having headache which did not improve after Tylenol.  Also states she threw up after eating Ensure but has had Zofran since.  States she is hungry and requesting crackers and chocolate pudding.   ROS: negative except above  Examination  Vital signs in last 24 hours: Temp:  [97.9 F (36.6 C)-99.1 F (37.3 C)] 98 F (36.7 C) (12/22 1111) Pulse Rate:  [51-98] 51 (12/22 1111) Resp:  [15-25] 20 (12/22 1111) BP: (90-102)/(48-63) 96/59 (12/22 1111) SpO2:  [98 %-100 %] 100 % (12/22 1111) Weight:  [48.5 kg] 48.5 kg (12/22 0530)  General: lying in bed, not in apparent distress CVS: pulse-normal rate and rhythm RS: breathing comfortably Extremities: normal   Neuro: MS: Alert, oriented, follows commands CN: pupils equal and reactive,  EOMI, face symmetric, tongue midline, normal sensation over face Motor: Antigravity strength in all 4 extremities Reflexes: 2+ bilaterally over patella, biceps, plantars: flexor Coordination: normal Gait: not tested  Basic Metabolic Panel: Recent Labs  Lab 11/17/19 0005 11/17/19 0446 11/18/19 2215  NA 139 139 139  K 3.3* 3.5 3.8  CL 107 107 107  CO2 24 22 24   GLUCOSE 122* 95 114*  BUN 15 13 15   CREATININE 0.76 0.72 0.69  CALCIUM 9.3 8.8* 8.9  MG  --  2.1  --     CBC: Recent Labs  Lab 11/17/19 0005 11/17/19 0446 11/18/19 2215  WBC 6.2 4.9 5.4  NEUTROABS  --  3.2  --   HGB 13.7 12.8 12.4  HCT 39.1 36.9 35.2*  MCV 90.9 90.4 89.8  PLT 272 255 264     Coagulation Studies: No results for input(s): LABPROT, INR in the last 72 hours.  Imaging MRI brain with and without contrast 11/17/2019: No acute abnormality.  ASSESSMENT AND PLAN: 18 year old female admitted for seizure-like episodes.  Convulsion -LTM EEG so far did not show any interictal/ictal activity.  Recommendations - Continue LTM EEG for  characterization of spells - Not on any AEDs at this point - seizure precautions - PRN IV Ativan for GTC lasting more than 2 minutes of focal seizure lasting more than 5 minutes  Thank you for allowing [place] to participate in the care of this patient.  Please page neuro hospitalist for any further questions after 5 PM.  Addendum - Called in patient's room to evaluate after events. Reviewed eeg, its non epileptic event. Discussed diagnosis with patient and girlfriend at bedside.    I have spent a total of  28 minutes with the patient reviewing hospital notes,  test results, labs and examining the patient as well as establishing an assessment and plan that was discussed personally with the patient.  > 50% of time was spent in direct patient care.

## 2019-11-19 NOTE — Progress Notes (Signed)
Patient arrived to the floor, was able to walk to the bed and answer all questions appropriately. Patient is resting in bed comfortably.

## 2019-11-19 NOTE — Progress Notes (Signed)
Initial Nutrition Assessment  DOCUMENTATION CODES:   Underweight  INTERVENTION:  Continue Ensure Enlive po BID, each supplement provides 350 kcal and 20 grams of protein  Encourage adequate PO intake.   NUTRITION DIAGNOSIS:   Increased nutrient needs related to acute illness as evidenced by estimated needs.  GOAL:   Patient will meet greater than or equal to 90% of their needs  MONITOR:   PO intake, Supplement acceptance, Skin, Weight trends, Labs, I & O's  REASON FOR ASSESSMENT:   Malnutrition Screening Tool    ASSESSMENT:   18 y.o. female with history of asthma presents with seizures.  Pt unavailable during attempted time of visit. RD unable to obtain pt nutrition history. Pt currently has Ensure ordered. RD to continue with current orders to aid in caloric and protein needs.   Unable to complete Nutrition-Focused physical exam at this time.   Labs and medications reviewed.   Diet Order:   Diet Order            Diet regular Room service appropriate? Yes; Fluid consistency: Thin  Diet effective now              EDUCATION NEEDS:   Not appropriate for education at this time  Skin:  Skin Assessment: Reviewed RN Assessment  Last BM:  12/19  Height:   Ht Readings from Last 1 Encounters:  11/19/19 5\' 4"  (1.626 m)    Weight:   Wt Readings from Last 1 Encounters:  11/19/19 48.5 kg    Ideal Body Weight:  54.5 kg  BMI:  Body mass index is 18.35 kg/m.  Estimated Nutritional Needs:   Kcal:  1157-2620  Protein:  75-90 grams  Fluid:  >/= 1.7 L/day    Corrin Parker, MS, RD, LDN Pager # [phone] After hours/ weekend pager # [phone]

## 2019-11-20 DIAGNOSIS — F445 Conversion disorder with seizures or convulsions: Secondary | ICD-10-CM | POA: Diagnosis not present

## 2019-11-20 DIAGNOSIS — R569 Unspecified convulsions: Secondary | ICD-10-CM | POA: Diagnosis not present

## 2019-11-20 LAB — CBC
HCT: 35.9 % — ABNORMAL LOW (ref 36.0–46.0)
Hemoglobin: 12.4 g/dL (ref 12.0–15.0)
MCH: 31.6 pg (ref 26.0–34.0)
MCHC: 34.5 g/dL (ref 30.0–36.0)
MCV: 91.3 fL (ref 80.0–100.0)
Platelets: 238 10*3/uL (ref 150–400)
RBC: 3.93 MIL/uL (ref 3.87–5.11)
RDW: 11.3 % — ABNORMAL LOW (ref 11.5–15.5)
WBC: 4.2 10*3/uL (ref 4.0–10.5)
nRBC: 0 % (ref 0.0–0.2)

## 2019-11-20 LAB — BASIC METABOLIC PANEL
Anion gap: 9 (ref 5–15)
BUN: 16 mg/dL (ref 6–20)
CO2: 25 mmol/L (ref 22–32)
Calcium: 8.9 mg/dL (ref 8.9–10.3)
Chloride: 104 mmol/L (ref 98–111)
Creatinine, Ser: 0.75 mg/dL (ref 0.44–1.00)
GFR calc Af Amer: >60 mL/min (ref >60)
GFR calc non Af Amer: >60 mL/min (ref >60)
Glucose, Bld: 84 mg/dL (ref 70–99)
Potassium: 3.7 mmol/L (ref 3.5–5.1)
Sodium: 138 mmol/L (ref 135–145)

## 2019-11-20 LAB — MAGNESIUM: Magnesium: 2 mg/dL (ref 1.7–2.4)

## 2019-11-20 LAB — COPPER, SERUM: Copper: 107 ug/dL (ref 72–166)

## 2019-11-20 MED ORDER — HYDROXYZINE PAMOATE 25 MG PO CAPS: 25.0000 mg | ORAL_CAPSULE | Freq: Three times a day (TID) | ORAL | 0 refills | Status: DC | PRN

## 2019-11-20 NOTE — Progress Notes (Signed)
LTM EEG discontinued - no skin breakdown at [facility].  Tech : NA/CM

## 2019-11-20 NOTE — Progress Notes (Addendum)
Reason for consult: Seizure  Subjective: Two typical events were captured during the stay.  Patient reported significant stress due to parents being addicted to drugs.  States her girlfriend is her support system.  ROS: negative except above  Examination  Vital signs in last 24 hours: Temp:  [97.7 F (36.5 C)-98.7 F (37.1 C)] 98.7 F (37.1 C) (12/23 0840) Pulse Rate:  [60-89] 70 (12/23 0840) Resp:  [16-20] 16 (12/23 0840) BP: (94-113)/(50-69) 103/69 (12/23 0840) SpO2:  [93 %-100 %] 100 % (12/23 0840)  General: lying in bed, not in apparent distress CVS: pulse-normal rate and rhythm RS: breathing comfortably Extremities: normal   Neuro: MS: Alert, oriented, follows commands CN: pupils equal and reactive,  EOMI, face symmetric, tongue midline, normal sensation over face, Motor: 5/5 strength in all 4 extremities Reflexes: 2+ bilaterally over patella, biceps, plantars: flexor Coordination: normal Gait: not tested  Basic Metabolic Panel: Recent Labs  Lab 11/17/19 0005 11/17/19 0446 11/18/19 2215 11/20/19 0317  NA 139 139 139 138  K 3.3* 3.5 3.8 3.7  CL 107 107 107 104  CO2 24 22 24 25   GLUCOSE 122* 95 114* 84  BUN 15 13 15 16   CREATININE 0.76 0.72 0.69 0.75  CALCIUM 9.3 8.8* 8.9 8.9  MG  --  2.1  --  2.0    CBC: Recent Labs  Lab 11/17/19 0005 11/17/19 0446 11/18/19 2215 11/20/19 0317  WBC 6.2 4.9 5.4 4.2  NEUTROABS  --  3.2  --   --   HGB 13.7 12.8 12.4 12.4  HCT 39.1 36.9 35.2* 35.9*  MCV 90.9 90.4 89.8 91.3  PLT 272 255 264 238     Coagulation Studies: No results for input(s): LABPROT, INR in the last 72 hours.  Imaging MRI brain with and without contrast 11/17/2019: No acute abnormality.  ASSESSMENT AND PLAN: 18 year old female admitted for seizure-like episodes.  Nonepileptic spells/pseudoseizures -LTM EEG did not show any ictal-interictal activity. Two typical events were captured without EEG change and were  nonepileptic.  Recommendations -DC LTM EEG.  Patient does not need to be on any antiepileptic drugs. -Discussed the diagnosis of nonepileptic events.  Patient was receptive to the diagnosis and discussed recent stressors including parents with addiction to drugs.  -I discussed the importance of cognitive behavioral therapy and recommended follow-up with a psychiatrist/therapist as an outpatient -I assured some resources with the patient which further describe the diagnosis of nonepileptic events, management of stressors and behavioral therapies. -I discussed with the patient and her girlfriend how to manage these episodes at home as well as when is it time to bring the patient to ED (any physical injury, not returning to baseline after the episodes within 30 minutes, new types of episodes) -Recommended as needed Vistaril 25 mg -I discussed seizure precautions including do not drive for 6 months per [facility] state law/until cleared by physician  Thank you for allowing [place] to participate in the care of this patient.    Neurology will sign off.  Please page neuro hospitalist for any further questions after 5 PM.  I have spent a total of 1 minuteswith the patient reviewing hospitalnotes,  test results, labs and examining the patient as well as establishing an assessment and plan that was discussed personally with the patient.>50% of time was spent in direct patient care.

## 2019-11-20 NOTE — Discharge Summary (Signed)
Physician Discharge Summary  Natalie Simmons JFH:[id number] DOB: 04/12/2001 DOA: 11/18/2019  PCP: Joette Catching, MD  Admit date: 11/18/2019 Discharge date: 11/20/2019  Admitted From: Home  Discharge disposition: Home  Recommendations for Outpatient Follow-Up:    Follow up with your primary care provider in one week.  Discuss about having a referral to psychotherapist as outpatient.  Discharge Diagnosis:   Principal Problem:   Seizure-like activity [facility])  Discharge Condition: Improved.  Diet recommendation:   Regular.  Wound care: None.  Code status: Full.   History of Present Illness:   Natalie Simmons is a 18 y.o. female with history of asthma who was admitted and discharged 1 day prior to presentation after being observed for seizure-like activity at that time patient had extensive work-up including EEGs and MRI brain unremarkable.  Patient was discharged home on no antiepileptics.  Patient was reported to have 2 generalized tonic-clonic seizure-like activity back-to-back witnessed by patient's girlfriend.  This happened while at home watching TV.  No incontinence of urine or tongue biting during the episode.  Patient had severe headache and weakness of the lower extremity following the episode.  Was mildly confused per the patient after the episode.  ED Course: In the ER, patient appeared nonfocal.  Patient was afebrile.  No neck rigidity on exam.  Labs were unremarkable.  COVID-19 test was negative. Neurology on-call was consulted and admitted for further observation and continuous EEG monitoring.   Hospital Course:  Following conditions were addressed during hospitalization,  Seizure-like activity.  Likely to be pseudoseizures. Recent admission including EEG and MRI for seizure..  Neurology was consulted and recommended video EEG and overnight observation..   EEG did not show any seizure-like activity.  Neurology recommended vistaril on discharge for anxiety.  Patient  will have to follow-up with psychotherapist as outpatient.Patient was advised not to drive for 6 months or until cleared by physician.  Asthma.  Compensated at this time.  Resume home medication.    Medical Consultants:    Neurology   Subjective:   Today, patient feels okay.  Complains of mild weakness.  No seizure episodes reported.  Discharge Exam:   Vitals:   11/20/19 0408 11/20/19 0840  BP: (!) 113/55 103/69  Pulse: 79 70  Resp: 16 16  Temp: 98.2 F (36.8 C) 98.7 F (37.1 C)  SpO2: 100% 100%   Vitals:   11/19/19 2120 11/19/19 2342 11/20/19 0408 11/20/19 0840  BP: 103/67 (!) 94/50 (!) 113/55 103/69  Pulse: 60 89 79 70  Resp: 16 16 16 16   Temp: 97.7 F (36.5 C) 97.8 F (36.6 C) 98.2 F (36.8 C) 98.7 F (37.1 C)  TempSrc: Oral Oral Oral Oral  SpO2: 100% 93% 100% 100%  Weight:      Height:       General exam: Appears calm and comfortable ,Not in distress, thinly built.  EEG electrodes HEENT:PERRL,Oral mucosa moist Respiratory system: Bilateral equal air entry, normal vesicular breath sounds, no wheezes or crackles  Cardiovascular system: S1 & S2 heard, RRR.  Gastrointestinal system: Abdomen is nondistended, soft and nontender. No organomegaly or masses felt. Normal bowel sounds heard. Central nervous system: Alert and oriented. No focal neurological deficits. Extremities: No edema, no clubbing ,no cyanosis, distal peripheral pulses palpable. Skin: No rashes, lesions or ulcers,no icterus ,no pallor MSK: Normal muscle bulk,tone ,power    Procedures:    LTM EEG  The results of significant diagnostics from this hospitalization (including imaging, microbiology, ancillary and laboratory) are listed below for  reference.     Diagnostic Studies:   No results found.   Labs:   Basic Metabolic Panel: Recent Labs  Lab 11/17/19 0005 11/17/19 0446 11/18/19 2215 11/20/19 0317  NA 139 139 139 138  K 3.3* 3.5 3.8 3.7  CL 107 107 107 104  CO2 24 22 24 25    GLUCOSE 122* 95 114* 84  BUN 15 13 15 16   CREATININE 0.76 0.72 0.69 0.75  CALCIUM 9.3 8.8* 8.9 8.9  MG  --  2.1  --  2.0   GFR Estimated Creatinine Clearance: 85.9 mL/min (by C-G formula based on SCr of 0.75 mg/dL). Liver Function Tests: Recent Labs  Lab 11/17/19 0005 11/17/19 0446  AST 18 19  ALT 12 13  ALKPHOS 46 42  BILITOT 0.8 0.7  PROT 7.2 6.7  ALBUMIN 4.3 4.0   No results for input(s): LIPASE, AMYLASE in the last 168 hours. No results for input(s): AMMONIA in the last 168 hours. Coagulation profile No results for input(s): INR, PROTIME in the last 168 hours.  CBC: Recent Labs  Lab 11/17/19 0005 11/17/19 0446 11/18/19 2215 11/20/19 0317  WBC 6.2 4.9 5.4 4.2  NEUTROABS  --  3.2  --   --   HGB 13.7 12.8 12.4 12.4  HCT 39.1 36.9 35.2* 35.9*  MCV 90.9 90.4 89.8 91.3  PLT 272 255 264 238   Cardiac Enzymes: Recent Labs  Lab 11/17/19 0240  CKTOTAL 58  CKMB 1.0   BNP: Invalid input(s): POCBNP CBG: Recent Labs  Lab 11/18/19 2221  GLUCAP 102*   D-Dimer No results for input(s): DDIMER in the last 72 hours. Hgb A1c No results for input(s): HGBA1C in the last 72 hours. Lipid Profile No results for input(s): CHOL, HDL, LDLCALC, TRIG, CHOLHDL, LDLDIRECT in the last 72 hours. Thyroid function studies No results for input(s): TSH, T4TOTAL, T3FREE, THYROIDAB in the last 72 hours.  Invalid input(s): FREET3 Anemia work up No results for input(s): VITAMINB12, FOLATE, FERRITIN, TIBC, IRON, RETICCTPCT in the last 72 hours. Microbiology Recent Results (from the past 240 hour(s))  SARS CORONAVIRUS 2 (TAT 6-24 HRS) Nasopharyngeal Nasopharyngeal Swab     Status: None   Collection Time: 11/17/19  2:51 AM   Specimen: Nasopharyngeal Swab  Result Value Ref Range Status   SARS Coronavirus 2 NEGATIVE NEGATIVE Final    Comment: (NOTE) SARS-CoV-2 target nucleic acids are NOT DETECTED. The SARS-CoV-2 RNA is generally detectable in upper and lower respiratory specimens  during the acute phase of infection. Negative results do not preclude SARS-CoV-2 infection, do not rule out co-infections with other pathogens, and should not be used as the sole basis for treatment or other patient management decisions. Negative results must be combined with clinical observations, patient history, and epidemiological information. The expected result is Negative. Fact Sheet for Patients: HairSlick.nohttps://www.fda.gov/media/138098/download Fact Sheet for Healthcare Providers: quierodirigir.comhttps://www.fda.gov/media/138095/download This test is not yet approved or cleared by the [place] FDA and  has been authorized for detection and/or diagnosis of SARS-CoV-2 by FDA under an Emergency Use Authorization (EUA). This EUA will remain  in effect (meaning this test can be used) for the duration of the COVID-19 declaration under Section 56 4(b)(1) of the Act, 21 U.S.C. section 360bbb-3(b)(1), unless the authorization is terminated or revoked sooner. Performed at [facility], [address] [place], [place] [address]   SARS CORONAVIRUS 2 (TAT 6-24 HRS) Nasopharyngeal Nasopharyngeal Swab     Status: None   Collection Time: 11/19/19  3:24 AM  Specimen: Nasopharyngeal Swab  Result Value Ref Range Status   SARS Coronavirus 2 NEGATIVE NEGATIVE Final    Comment: (NOTE) SARS-CoV-2 target nucleic acids are NOT DETECTED. The SARS-CoV-2 RNA is generally detectable in upper and lower respiratory specimens during the acute phase of infection. Negative results do not preclude SARS-CoV-2 infection, do not rule out co-infections with other pathogens, and should not be used as the sole basis for treatment or other patient management decisions. Negative results must be combined with clinical observations, patient history, and epidemiological information. The expected result is Negative. Fact Sheet for Patients: SugarRoll.be Fact Sheet for Healthcare Providers:  https://www.woods-mathews.com/ This test is not yet approved or cleared by the [place] FDA and  has been authorized for detection and/or diagnosis of SARS-CoV-2 by FDA under an Emergency Use Authorization (EUA). This EUA will remain  in effect (meaning this test can be used) for the duration of the COVID-19 declaration under Section 56 4(b)(1) of the Act, 21 U.S.C. section 360bbb-3(b)(1), unless the authorization is terminated or revoked sooner. Performed at [facility], [place] [address], [place], [place] [address]      Discharge Instructions:   Discharge Instructions    Diet general   Complete by: As directed    Discharge instructions   Complete by: As directed    Follow-up with your primary care physician in 1 week.  Discuss about referral to psychotherapist as outpatient.   Increase activity slowly   Complete by: As directed      Allergies as of 11/20/2019      Reactions   Latex Itching      Medication List    TAKE these medications   Advil 200 MG Caps Generic drug: Ibuprofen Take 400-800 mg by mouth every 8 (eight) hours as needed (for moderate to severe headaches).   baclofen 10 MG tablet Commonly known as: LIORESAL Take 10 mg by mouth daily as needed for muscle spasms.   hydrOXYzine 25 MG capsule Commonly known as: Vistaril Take 1 capsule (25 mg total) by mouth 3 (three) times daily as needed.   One-A-Day Womens Formula Tabs Take 1 tablet by mouth daily with breakfast.   Ventolin HFA 108 (90 Base) MCG/ACT inhaler Generic drug: albuterol Inhale 2 puffs into the lungs every 6 (six) hours as needed for wheezing or shortness of breath.      Follow-up Information    Dione Housekeeper, MD. Schedule an appointment as soon as possible for a visit in 1 week(s).   Specialty: Family Medicine Contact information: [place]  [phone]            Time coordinating discharge: 39 minutes  Signed:   Lash Matulich  [facility] 11/20/2019, 9:44 AM

## 2019-11-20 NOTE — TOC Transition Note (Signed)
Transition of Care [facility]) - CM/SW Discharge Note   Patient Details  Name: Natalie Simmons MRN: [id number] Date of Birth: 03-03-2001  Transition of [facility]) CM/SW Contact:  Pollie Friar, RN Phone Number: 11/20/2019, 11:05 AM   Clinical Narrative:    Pt discharging home with self care. Pt has hospital f/u, insurance and transportation home.    Final next level of care: Home/Self Care Barriers to Discharge: No Barriers Identified   Patient Goals and CMS Choice        Discharge Placement                       Discharge Plan and Services                                     Social Determinants of Health (SDOH) Interventions     Readmission Risk Interventions No flowsheet data found.

## 2019-11-20 NOTE — Procedures (Signed)
Patient Name: Natalie Simmons  MRN: [id number]  Epilepsy Attending: Lora Havens  Referring Physician/Provider: Dr. Gean Birchwood Duration: 11/20/2019 0955 to 11/20/2019 1203  Patient history: 18 year old female with full body shaking episodes concerning for seizure.  EEG for characterization of spells.  Level of alertness: Awake  AEDs during EEG study: None  Technical aspects: This EEG study was done with scalp electrodes positioned according to the 10-20 International system of electrode placement. Electrical activity was acquired at a sampling rate of 500Hz  and reviewed with a high frequency filter of 70Hz  and a low frequency filter of 1Hz . EEG data were recorded continuously and digitally stored.   Description: During awake state, the posterior dominant rhythm consists of 9-10 Hz activity of moderate voltage (25-35 uV) seen predominantly in posterior head regions, symmetric and reactive to eye opening and eye closing. Hyperventilation and photic stimulation were not performed.  IMPRESSION: This study is within normal limits. No seizures or epileptiform discharges were seen throughout the recording.  [name]

## 2019-11-20 NOTE — Progress Notes (Signed)
Pt discharge instructions and AVS reviewed with patient. All questions answered at time of discharge. IV and tele removed without complication. Pt discharged from facility with belongings in hand.

## 2019-11-20 NOTE — Procedures (Addendum)
Patient Name: Natalie Simmons  MRN: [id number]  Epilepsy Attending: Lora Havens  Referring Physician/Provider: Dr. Gean Birchwood Duration: 11/19/2019 4585 to 11/20/2019 0955  Patient history: 18 year old female with full body shaking episodes concerning for seizure.  EEG for characterization of spells.  Level of alertness: Awake, sleep  AEDs during EEG study: None  Technical aspects: This EEG study was done with scalp electrodes positioned according to the 10-20 International system of electrode placement. Electrical activity was acquired at a sampling rate of 500Hz  and reviewed with a high frequency filter of 70Hz  and a low frequency filter of 1Hz . EEG data were recorded continuously and digitally stored.   Description: During awake state, the posterior dominant rhythm consists of 9-10 Hz activity of moderate voltage (25-35 uV) seen predominantly in posterior head regions, symmetric and reactive to eye opening and eye closing.  Sleep was characterized by vertex use, sleep spindles (12 to 14 Hz), maximal frontocentral.  Event button was pressed on 11/19/2019 at 1428 and 11/20/2019 0907. Patient was noted to have her eyes closed, poor position in bed and intermittent whole-body nonrhythmic twitching.  These episode lasted for about 10 minutes. Intermittently the twitching would stop and patient would follow commands, and then twitching would resume. Concomitant EEG before during and after the event showed normal posterior dominant rhythm. Hyperventilation and photic stimulation were not performed.  IMPRESSION: This study is within normal limits. Two typical events were captured on 11/19/2019 at 1428 and 11/20/2019 at 0907 without concomitant EEG change and were therefore nonepileptic events. No seizures or epileptiform discharges were seen throughout the recording.  Natalie Simmons

## 2019-12-18 NOTE — Progress Notes (Signed)
New Patient Office Visit  Assessment & Plan:  1. Generalized anxiety disorder - Starting patient on Lexapro 10 mg once daily.  She will continue hydroxyzine as needed.  She has never been on any other medications to treat her anxiety.  Discussed we can certainly try medication alone since she does not wish to do counseling.  Patient will be unable to work until we get this under control as her seizure-like activity is unpredictable.  She is unable to drive to work and I would also fear that at work if 1 of these episodes came on she may injure herself.  Patient reports she has FMLA forms to be completed that she will bring back to the office.  We discussed going ahead and writing her out for the entire 12 weeks as this may take some time to get under control since these medications take approximately 6 weeks to really get in your system and give you the maximum benefit. - escitalopram (LEXAPRO) 10 MG tablet; Take 1 tablet (10 mg total) by mouth daily.  Dispense: 30 tablet; Refill: 2  2. Seizure-like activity [facility]) - Patient has an appointment scheduled with a neurologist on February 4th which she will keep.  3. Immunization due - Tdap (BOOSTRIX) 5-2.5-18.5 LF-MCG/0.5 injection; Inject 0.5 mLs into the muscle once for 1 dose.  Dispense: 0.5 mL; Refill: 0   Follow-up: Return in about 6 weeks (around 01/30/2020) for anxiety (telephone).   Deliah Boston, MSN, APRN, FNP-C [facility]  Subjective:  Patient ID: Disney Ruggiero, female    DOB: 07/19/2001  Age: 19 y.o. MRN: [id number]  Patient Care Team: Gwenlyn Fudge, FNP as PCP - General (Family Medicine)  CC:  Chief Complaint  Patient presents with  . Establish Care  . Anxiety    HPI Vercie Pokorny presents to establish care.    Asthma: Patient only requires use of albuterol during exercise.  Anxiety/seizures: Patient has been experiencing seizure-like activity for over a month now.  She has been to the  emergency room twice.  Her last visit was on 11/18/2019.  She has had an extensive work-up including EEGs and brain MRI which were unremarkable.  Neurology was consulted in the hospital who felt the seizure-like activity was likely to be pseudoseizures and recommended Vistaril on discharge for anxiety and follow-up with psychotherapist as an outpatient.  Patient was advised not to drive for 6 months or until cleared by a physician.  Patient does not wish to see a psychotherapist if she can avoid it.  She admits that she has many dark secrets that she will never talk to anyone about.  She would like to try medication to see if this would be helpful for her without the counseling.  Patient is currently unable to work due to this seizure-like activity.  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  There is concern of her falling off the chair during 1 of these episodes and hitting the floor.  Her episodes occur pretty frequently per her report.  The weekend prior to being hospitalized she reports she had 16 of these episodes.  She reports the entire episode from start to finish last about 15 minutes and afterwards she is exhausted.  She reports the actual time unconscious typically last from 30 seconds to 1 minute from what she is told.  As of today she has not had an episode in the past 6 days.  She has been  taking hydroxyzine as needed but feels she is not really had that much anxiety since she has just been at home and not going anywhere.  She is unable to drive at this time which would also make it difficult to get to work as she works out in [employer].   Depression screen PHQ 2/9 12/19/2019  Down, Depressed, Hopeless 0  PHQ - 2 Score 0  Altered sleeping 3  Tired, decreased energy 0  Change in appetite 1  Feeling bad or failure about yourself  1  Trouble concentrating 0  Moving slowly or fidgety/restless 0  Suicidal thoughts 0  PHQ-9 Score 5  Difficult doing work/chores Not  difficult at all   GAD 7 : Generalized Anxiety Score 12/19/2019  Nervous, Anxious, on Edge 3  Control/stop worrying 3  Worry too much - different things 3  Trouble relaxing 3  Restless 3  Easily annoyed or irritable 3  Afraid - awful might happen 3  Total GAD 7 Score 21  Anxiety Difficulty Very difficult    Review of Systems  Constitutional: Negative for chills, fever, malaise/fatigue and weight loss.  HENT: Negative for congestion, ear discharge, ear pain, nosebleeds, sinus pain, sore throat and tinnitus.   Eyes: Negative for blurred vision, double vision, pain, discharge and redness.  Respiratory: Negative for cough, shortness of breath and wheezing.   Cardiovascular: Negative for chest pain, palpitations and leg swelling.  Gastrointestinal: Negative for abdominal pain, constipation, diarrhea, heartburn, nausea and vomiting.  Genitourinary: Negative for dysuria, frequency and urgency.  Musculoskeletal: Negative for myalgias.  Skin: Negative for rash.  Neurological: Positive for seizures. Negative for dizziness, weakness and headaches.  Psychiatric/Behavioral: Negative for depression, substance abuse and suicidal ideas. The patient is nervous/anxious.     Current Outpatient Medications:  .  albuterol (VENTOLIN HFA) 108 (90 Base) MCG/ACT inhaler, Inhale 2 puffs into the lungs every 6 (six) hours as needed for wheezing or shortness of breath., Disp: , Rfl:  .  hydrOXYzine (VISTARIL) 25 MG capsule, Take 1 capsule (25 mg total) by mouth 3 (three) times daily as needed., Disp: 30 capsule, Rfl: 0 .  Ibuprofen (ADVIL) 200 MG CAPS, Take 400-800 mg by mouth every 8 (eight) hours as needed (for moderate to severe headaches)., Disp: , Rfl:  .  Multiple Vitamins-Calcium (ONE-A-DAY WOMENS FORMULA) TABS, Take 1 tablet by mouth daily with breakfast., Disp: , Rfl:  .  escitalopram (LEXAPRO) 10 MG tablet, Take 1 tablet (10 mg total) by mouth daily., Disp: 30 tablet, Rfl: 2  Allergies  Allergen  Reactions  . Latex Itching    Past Medical History:  Diagnosis Date  . Asthma   . Seizures (HCC)     Past Surgical History:  Procedure Laterality Date  . WISDOM TOOTH EXTRACTION      Family History  Problem Relation Age of Onset  . Drug abuse Mother   . Drug abuse Father   . Thyroid disease Father   . Kidney cancer Maternal Grandmother   . COPD Maternal Grandfather   . Thyroid disease Paternal Grandmother   . Heart disease Paternal Grandmother   . Diabetes Paternal Grandfather     Social History   Socioeconomic History  . Marital status: Single    Spouse name: Not on file  . Number of children: Not on file  . Years of education: Not on file  . Highest education level: Not on file  Occupational History  . Not on file  Tobacco Use  . Smoking status: Never  Smoker  . Smokeless tobacco: Never Used  Substance and Sexual Activity  . Alcohol use: No  . Drug use: No  . Sexual activity: Not on file  Other Topics Concern  . Not on file  Social History Narrative  . Not on file   Social Determinants of Health   Financial Resource Strain:   . Difficulty of Paying Living Expenses: Not on file  Food Insecurity:   . Worried About Charity fundraiser in the Last Year: Not on file  . Ran Out of Food in the Last Year: Not on file  Transportation Needs:   . Lack of Transportation (Medical): Not on file  . Lack of Transportation (Non-Medical): Not on file  Physical Activity:   . Days of Exercise per Week: Not on file  . Minutes of Exercise per Session: Not on file  Stress:   . Feeling of Stress : Not on file  Social Connections:   . Frequency of Communication with Friends and Family: Not on file  . Frequency of Social Gatherings with Friends and Family: Not on file  . Attends Religious Services: Not on file  . Active Member of Clubs or Organizations: Not on file  . Attends Archivist Meetings: Not on file  . Marital Status: Not on file  Intimate Partner  Violence:   . Fear of Current or Ex-Partner: Not on file  . Emotionally Abused: Not on file  . Physically Abused: Not on file  . Sexually Abused: Not on file    Objective:   Today's Vitals: BP 113/69   Pulse 81   Temp 99 F (37.2 C) (Temporal)   Ht 5\' 4"  (1.626 m)   Wt 111 lb (50.3 kg)   LMP 12/12/2019 (Exact Date)   SpO2 98%   BMI 19.05 kg/m   Physical Exam Vitals reviewed.  Constitutional:      General: She is not in acute distress.    Appearance: Normal appearance. She is underweight. She is not ill-appearing, toxic-appearing or diaphoretic.  HENT:     Head: Normocephalic and atraumatic.  Eyes:     General: No scleral icterus.       Right eye: No discharge.        Left eye: No discharge.     Conjunctiva/sclera: Conjunctivae normal.  Cardiovascular:     Rate and Rhythm: Normal rate and regular rhythm.     Heart sounds: Normal heart sounds. No murmur. No friction rub. No gallop.   Pulmonary:     Effort: Pulmonary effort is normal. No respiratory distress.     Breath sounds: Normal breath sounds. No stridor. No wheezing, rhonchi or rales.  Musculoskeletal:        General: Normal range of motion.     Cervical back: Normal range of motion.  Skin:    General: Skin is warm and dry.     Capillary Refill: Capillary refill takes less than 2 seconds.  Neurological:     General: No focal deficit present.     Mental Status: She is alert and oriented to person, place, and time. Mental status is at baseline.  Psychiatric:        Attention and Perception: Attention and perception normal.        Mood and Affect: Mood and affect normal.        Speech: Speech normal.        Behavior: Behavior normal.        Thought Content: Thought content normal.  Judgment: Judgment normal.

## 2019-12-19 ENCOUNTER — Ambulatory Visit (INDEPENDENT_AMBULATORY_CARE_PROVIDER_SITE_OTHER): Payer: BC Managed Care – PPO | Admitting: Family Medicine

## 2019-12-19 ENCOUNTER — Ambulatory Visit: Payer: BC Managed Care – PPO | Admitting: Family Medicine

## 2019-12-19 ENCOUNTER — Encounter: Payer: Self-pay | Admitting: Family Medicine

## 2019-12-19 ENCOUNTER — Other Ambulatory Visit: Payer: Self-pay

## 2019-12-19 VITALS — BP 113/69 | HR 81 | Temp 99.0°F | Ht 64.0 in | Wt 111.0 lb

## 2019-12-19 DIAGNOSIS — R569 Unspecified convulsions: Secondary | ICD-10-CM

## 2019-12-19 DIAGNOSIS — F411 Generalized anxiety disorder: Secondary | ICD-10-CM | POA: Diagnosis not present

## 2019-12-19 DIAGNOSIS — Z029 Encounter for administrative examinations, unspecified: Secondary | ICD-10-CM

## 2019-12-19 DIAGNOSIS — Z23 Encounter for immunization: Secondary | ICD-10-CM | POA: Diagnosis not present

## 2019-12-19 MED ORDER — ESCITALOPRAM OXALATE 10 MG PO TABS: 10.0000 mg | ORAL_TABLET | Freq: Every day | ORAL | 2 refills | Status: DC

## 2019-12-19 MED ORDER — TETANUS-DIPHTH-ACELL PERTUSSIS 5-2.5-18.5 LF-MCG/0.5 IM SUSP: 0.5000 mL | Freq: Once | INTRAMUSCULAR | 0 refills | Status: AC

## 2019-12-23 ENCOUNTER — Ambulatory Visit: Payer: BC Managed Care – PPO | Admitting: Family Medicine

## 2020-01-02 ENCOUNTER — Other Ambulatory Visit: Payer: Self-pay

## 2020-01-02 ENCOUNTER — Encounter: Payer: Self-pay | Admitting: Neurology

## 2020-01-02 ENCOUNTER — Ambulatory Visit (INDEPENDENT_AMBULATORY_CARE_PROVIDER_SITE_OTHER): Payer: BC Managed Care – PPO | Admitting: Neurology

## 2020-01-02 VITALS — BP 103/71 | HR 70 | Temp 97.7°F | Ht 64.0 in | Wt 107.5 lb

## 2020-01-02 DIAGNOSIS — R569 Unspecified convulsions: Secondary | ICD-10-CM | POA: Insufficient documentation

## 2020-01-02 DIAGNOSIS — F419 Anxiety disorder, unspecified: Secondary | ICD-10-CM | POA: Diagnosis not present

## 2020-01-02 NOTE — Progress Notes (Signed)
PATIENT: Natalie Simmons DOB: 05-26-2001  Chief Complaint  Patient presents with  . Seizure-like activity    From ED: Observed for seizure-like activity - pt had extensive work-up including EEGs and MRI brain unremarkable. Pt was discharged home on no antiepileptics. Pt was reported to have 2 generalized tonic-clonic seizure-like activity back-to-back witnessed by patient's girlfriend. This happened while at home watching TV. No incontinence of urine or tongue biting during the episode. Severe headache and weakness of the lower extremity following the episode-mildly confused afterwards. Estimates 20+ events.    Marland Kitchen PCP     Natalie Brooklyn, FNP  (referred from hospital). She has been started on Lexapro because it was felt the events were related to anxiety.     HISTORICAL  Natalie Simmons is a 19 year old female, seen in request by hospital nurse practitioner Natalie Simmons to follow-up her hospital admission in December 2020.  I have reviewed and summarized the referring note from the referring physician.   Patient was admitted to the hospital twice in December 2020,   She was seen by neuro hospitalist Natalie Simmons on November 17, 2019, reported symptoms started in 2017, feel her heart fluttering, passing out for 30 seconds to 1 minute, then came around, mild postevent confusion, and return to baseline  At that time she was evaluated by her primary care physician, cardiologist, Holter monitoring, echocardiogram showed no significant abnormality.  She continue have the spells, sometimes followed by whole body shaking, since October 2020, she had increased frequency of the spells, uncontrollable shaking movement of her legs,  Per record, both her parents are drug addict, she has to be taken away from her parents, her grandparents are her legal guardians.  Two of her half-sisters from her mom had complications because of her mother's opiate addiction.  At age 19, she has sustained a fall from  stairs, suffered head injury.  She also reported episodes of waking up from sleep on the floor without realizing what has happened.  She also reported history of migraine headaches, previously she used to drink heavily caffeinated drinks,  She was seen  by neuro-hospitalist Natalie Simmons on November 18, 2019, she had 2 seizure-like spells,  Rigidity of 4 extremities, she has no recollection of the event, was witnessed by her girlfriend, there was eyes rolled back, there was no tongue biting or urinary incontinence,  Patient complains of 1000/10 headaches, blurry vision, bilateral lower extremity weakness with numbness, vertigo, fainting sensation  I personally reviewed MRI of the brain with without contrast on November 17, 2019 that was normal.  Laboratory evaluation in December 2020, normal CBC, hemoglobin of 12.4, normal BMP, TSH, ferritin, ESR, C-reactive protein, vitamin B12, copper level, heavy metal screen, HIV, CPK, troponin UDS was positive for marijuana  EEG was normal on November 17, 2019  24 hours overnight EEG by Dr. Hortense Ramal was normal, there was 2 pushbutton event, patient was noted to have eyes closed, poor position in bed, intermittent whole body nonrhythmic twitching, lasting about 10 minutes, intermittently the twitching would stop, patient will follow commands, then twitching would resume.  Concomitant EEG before, during and after the event shows normal posterior dominant rhythm.  The conclusion was this event was nonepileptic.  She was started on Lexapro 10 mg since January 2021, she reported dramatic improvement, she is now less anxious, can sleep better, had no recurrent seizure-like spells over the past 2 weeks   REVIEW OF SYSTEMS: Full 14 system review of systems performed and notable only for  as above All other review of systems were negative.  ALLERGIES: Allergies  Allergen Reactions  . Latex Itching    HOME MEDICATIONS: Current Outpatient Medications    Medication Sig Dispense Refill  . albuterol (VENTOLIN HFA) 108 (90 Base) MCG/ACT inhaler Inhale 2 puffs into the lungs every 6 (six) hours as needed for wheezing or shortness of breath.    . escitalopram (LEXAPRO) 10 MG tablet Take 1 tablet (10 mg total) by mouth daily. 30 tablet 2  . hydrOXYzine (VISTARIL) 25 MG capsule Take 1 capsule (25 mg total) by mouth 3 (three) times daily as needed. 30 capsule 0  . Ibuprofen (ADVIL) 200 MG CAPS Take 400-800 mg by mouth every 8 (eight) hours as needed (for moderate to severe headaches).    . Multiple Vitamins-Calcium (ONE-A-DAY WOMENS FORMULA) TABS Take 1 tablet by mouth daily with breakfast.     No current facility-administered medications for this visit.    PAST MEDICAL HISTORY: Past Medical History:  Diagnosis Date  . Anxiety   . Asthma   . Seizure-like activity ([place])     PAST SURGICAL HISTORY: Past Surgical History:  Procedure Laterality Date  . WISDOM TOOTH EXTRACTION      FAMILY HISTORY: Family History  Problem Relation Age of Onset  . Drug abuse Mother   . Drug abuse Father   . Thyroid disease Father   . Kidney cancer Maternal Grandmother   . COPD Maternal Grandfather   . Thyroid disease Paternal Grandmother   . Heart disease Paternal Grandmother   . Diabetes Paternal Grandfather     SOCIAL HISTORY: Social History   Socioeconomic History  . Marital status: Single    Spouse name: Not on file  . Number of children: 0  . Years of education: 42  . Highest education level: High school graduate  Occupational History  . Occupation: Doctor, hospital  Tobacco Use  . Smoking status: Never Smoker  . Smokeless tobacco: Never Used  Substance and Sexual Activity  . Alcohol use: No  . Drug use: No  . Sexual activity: Not on file  Other Topics Concern  . Not on file  Social History Narrative   Lives at home with roommate.   Right-handed.   Caffeine use: 3-4 cups per day.   Social Determinants of Health   Financial  Resource Strain:   . Difficulty of Paying Living Expenses: Not on file  Food Insecurity:   . Worried About Charity fundraiser in the Last Year: Not on file  . Ran Out of Food in the Last Year: Not on file  Transportation Needs:   . Lack of Transportation (Medical): Not on file  . Lack of Transportation (Non-Medical): Not on file  Physical Activity:   . Days of Exercise per Week: Not on file  . Minutes of Exercise per Session: Not on file  Stress:   . Feeling of Stress : Not on file  Social Connections:   . Frequency of Communication with Friends and Family: Not on file  . Frequency of Social Gatherings with Friends and Family: Not on file  . Attends Religious Services: Not on file  . Active Member of Clubs or Organizations: Not on file  . Attends Archivist Meetings: Not on file  . Marital Status: Not on file  Intimate Partner Violence:   . Fear of Current or Ex-Partner: Not on file  . Emotionally Abused: Not on file  . Physically Abused: Not on file  . Sexually Abused:  Not on file     PHYSICAL EXAM   Vitals:   01/02/20 0828  BP: 103/71  Pulse: 70  Temp: 97.7 F (36.5 C)  Weight: 107 lb 8 oz (48.8 kg)  Height: _0  (1.626 m)    Not recorded      Body mass index is 18.45 kg/m.  PHYSICAL EXAMNIATION:  Gen: NAD, conversant, well nourised, well groomed                     Cardiovascular: Regular rate rhythm, no peripheral edema, warm, nontender. Eyes: Conjunctivae clear without exudates or hemorrhage Neck: Supple, no carotid bruits. Pulmonary: Clear to auscultation bilaterally   NEUROLOGICAL EXAM:  MENTAL STATUS: Speech:    Speech is normal; fluent and spontaneous with normal comprehension.  Cognition:     Orientation to time, place and person     Normal recent and remote memory     Normal Attention span and concentration     Normal Language, naming, repeating,spontaneous speech     Fund of knowledge   CRANIAL NERVES: CN II: Visual fields are  full to confrontation. Pupils are round equal and briskly reactive to light. CN III, IV, VI: extraocular movement are normal. No ptosis. CN V: Facial sensation is intact to light touch CN VII: Face is symmetric with normal eye closure  CN VIII: Hearing is normal to causal conversation. CN IX, X: Phonation is normal. CN XI: Head turning and shoulder shrug are intact  MOTOR: There is no pronator drift of out-stretched arms. Muscle bulk and tone are normal. Muscle strength is normal.  REFLEXES: Reflexes are 2+ and symmetric at the biceps, triceps, knees, and ankles. Plantar responses are flexor.  SENSORY: Intact to light touch, pinprick and vibratory sensation are intact in fingers and toes.  COORDINATION: There is no trunk or limb dysmetria noted.  GAIT/STANCE: Posture is normal. Gait is steady with normal steps, base, arm swing, and turning. Heel and toe walking are normal. Tandem gait is normal.  Romberg is absent.   DIAGNOSTIC DATA (LABS, IMAGING, TESTING) - I reviewed patient records, labs, notes, testing and imaging myself where available.   ASSESSMENT AND PLAN  Sharissa Brierley is a 19 y.o. female   Nonepileptic seizure-like spells Anxiety  Extensive evaluation detailed above was essentially normal  Her symptoms has much improved after starting Lexapro 10 mg daily  Continue follow-up with her primary care physician, only return to clinic for new issues   Marcial Pacas, M.D. Ph.D.  [facility] [address] [place], [place] [address] Ph: [phone] Fax: [phone]  CC: Referring Provider

## 2020-01-03 ENCOUNTER — Emergency Department (HOSPITAL_COMMUNITY)
Admission: EM | Admit: 2020-01-03 | Discharge: 2020-01-03 | Disposition: A | Discharge status: Home / Self Care | Payer: BC Managed Care – PPO | Attending: Emergency Medicine | Admitting: Emergency Medicine

## 2020-01-03 ENCOUNTER — Other Ambulatory Visit: Payer: Self-pay

## 2020-01-03 ENCOUNTER — Encounter (HOSPITAL_COMMUNITY): Payer: Self-pay

## 2020-01-03 DIAGNOSIS — R1011 Right upper quadrant pain: Secondary | ICD-10-CM | POA: Diagnosis not present

## 2020-01-03 DIAGNOSIS — Z3202 Encounter for pregnancy test, result negative: Secondary | ICD-10-CM | POA: Insufficient documentation

## 2020-01-03 DIAGNOSIS — Z91040 Latex allergy status: Secondary | ICD-10-CM | POA: Insufficient documentation

## 2020-01-03 DIAGNOSIS — J45909 Unspecified asthma, uncomplicated: Secondary | ICD-10-CM | POA: Insufficient documentation

## 2020-01-03 DIAGNOSIS — Z79899 Other long term (current) drug therapy: Secondary | ICD-10-CM | POA: Insufficient documentation

## 2020-01-03 DIAGNOSIS — R0789 Other chest pain: Secondary | ICD-10-CM | POA: Diagnosis not present

## 2020-01-03 LAB — URINALYSIS, ROUTINE W REFLEX MICROSCOPIC
Bacteria, UA: NONE SEEN
Bilirubin Urine: NEGATIVE
Glucose, UA: NEGATIVE mg/dL
Hgb urine dipstick: NEGATIVE
Ketones, ur: 80 mg/dL — AB
Leukocytes,Ua: NEGATIVE
Nitrite: NEGATIVE
Protein, ur: 30 mg/dL — AB
Specific Gravity, Urine: 1.028 (ref 1.005–1.030)
pH: 5 (ref 5.0–8.0)

## 2020-01-03 LAB — COMPREHENSIVE METABOLIC PANEL
ALT: 13 U/L (ref 0–44)
AST: 18 U/L (ref 15–41)
Albumin: 4.7 g/dL (ref 3.5–5.0)
Alkaline Phosphatase: 48 U/L (ref 38–126)
Anion gap: 10 (ref 5–15)
BUN: 11 mg/dL (ref 6–20)
CO2: 24 mmol/L (ref 22–32)
Calcium: 9 mg/dL (ref 8.9–10.3)
Chloride: 103 mmol/L (ref 98–111)
Creatinine, Ser: 0.65 mg/dL (ref 0.44–1.00)
GFR calc Af Amer: >60 mL/min (ref >60)
GFR calc non Af Amer: >60 mL/min (ref >60)
Glucose, Bld: 86 mg/dL (ref 70–99)
Potassium: 3.4 mmol/L — ABNORMAL LOW (ref 3.5–5.1)
Sodium: 137 mmol/L (ref 135–145)
Total Bilirubin: 1.3 mg/dL — ABNORMAL HIGH (ref 0.3–1.2)
Total Protein: 7.7 g/dL (ref 6.5–8.1)

## 2020-01-03 LAB — CBC
HCT: 39.1 % (ref 36.0–46.0)
Hemoglobin: 13.3 g/dL (ref 12.0–15.0)
MCH: 31.7 pg (ref 26.0–34.0)
MCHC: 34 g/dL (ref 30.0–36.0)
MCV: 93.1 fL (ref 80.0–100.0)
Platelets: 264 10*3/uL (ref 150–400)
RBC: 4.2 MIL/uL (ref 3.87–5.11)
RDW: 11.7 % (ref 11.5–15.5)
WBC: 7.2 10*3/uL (ref 4.0–10.5)
nRBC: 0 % (ref 0.0–0.2)

## 2020-01-03 LAB — SEDIMENTATION RATE: Sed Rate: 5 mm/hr (ref 0–22)

## 2020-01-03 LAB — LIPASE, BLOOD: Lipase: 25 U/L (ref 11–51)

## 2020-01-03 LAB — C-REACTIVE PROTEIN: CRP: 0.6 mg/dL (ref <1.0)

## 2020-01-03 LAB — POC URINE PREG, ED: Preg Test, Ur: NEGATIVE

## 2020-01-03 LAB — POC OCCULT BLOOD, ED: Fecal Occult Bld: NEGATIVE

## 2020-01-03 MED ORDER — SODIUM CHLORIDE 0.9% FLUSH: 3.0000 mL | Freq: Once | INTRAVENOUS | Status: DC

## 2020-01-03 MED ORDER — POTASSIUM CHLORIDE CRYS ER 20 MEQ PO TBCR
20.0000 meq | EXTENDED_RELEASE_TABLET | Freq: Once | ORAL | Status: AC
  Administered 2020-01-03: 20 meq via ORAL
  Filled 2020-01-03: qty 1

## 2020-01-03 NOTE — ED Triage Notes (Signed)
Pt presents to ED with complaints of right upper abdominal pain started last week. Pt states she has also has bloody stools, vomiting. Pt states pain gets worse when she eats.

## 2020-01-03 NOTE — Discharge Instructions (Addendum)
Please return for outpatient right upper quadrant ultrasound.  I would also prefer that you notify your primary care provider regarding today's encounter.  You may wish to discuss your new SSRI should your symptoms continue to persist.  Your lab work today has been very reassuring.  Suspect that you have anterior chest wall discomfort secondary to rib strain.  Encourage you to take naproxen or similar over-the-counter NSAID for symptomatic relief.  You may also take your Zofran at home as needed for any nausea symptoms.  Please return to the ED or seek immediate medical attention for any new or worsening symptoms.

## 2020-01-03 NOTE — ED Notes (Signed)
Pt has finished drinking 20 0z bottle of water. Pt says she is ready to go, her ride is outside. Gerre Pebbles, [place] made aware.

## 2020-01-03 NOTE — ED Notes (Signed)
Pt drinking bottled water and tolerating well. Pt says she has been staying hydrated and urine is not dark, she just has no appetite and feels discomfort after eating.

## 2020-01-03 NOTE — ED Provider Notes (Signed)
[facility] EMERGENCY DEPARTMENT Provider Note   CSN: [id number] Arrival date & time: 01/03/20  1544     History Chief Complaint  Patient presents with  . Abdominal Pain    Natalie Simmons is a 19 y.o. female with no relevant PMH who presents to the ED with 1 week history of right-sided anterior chest wall discomfort that radiates to her back.  She reports that her discomfort is worse with eating and she has symptoms of nausea, as well.  She reports that she had possible gallstones in the past.  Subjective fevers and chills at home.  However, she also endorses exercising immediately prior to symptom onset and felt as though it may be related to a rib strain.  Patient is also reporting bright red blood per rectum x4 days and pale-colored stools.  She reports feeling particularly gassy despite lack of appetite.  She states that she has lost approximately 5 pounds due to her diminished appetite.  She denies any headache or dizziness, chest pain or difficulty breathing, cough, dysuria, increased urinary frequency, pain with defecation, history of hemorrhoids, or family history of autoimmune disease.   HPI     Past Medical History:  Diagnosis Date  . Anxiety   . Asthma   . Seizure-like activity [facility])     Patient Active Problem List   Diagnosis Date Noted  . Nonepileptic episode ([place]) 01/02/2020  . Anxiety 01/02/2020  . Seizure-like activity ([place]) 11/17/2019  . Hypokalemia 11/17/2019    Past Surgical History:  Procedure Laterality Date  . WISDOM TOOTH EXTRACTION       OB History   No obstetric history on file.     Family History  Problem Relation Age of Onset  . Drug abuse Mother   . Drug abuse Father   . Thyroid disease Father   . Kidney cancer Maternal Grandmother   . COPD Maternal Grandfather   . Thyroid disease Paternal Grandmother   . Heart disease Paternal Grandmother   . Diabetes Paternal Grandfather     Social History   Tobacco Use  . Smoking status: Never  Smoker  . Smokeless tobacco: Never Used  Substance Use Topics  . Alcohol use: No  . Drug use: No    Home Medications Prior to Admission medications   Medication Sig Start Date End Date Taking? Authorizing Provider  albuterol (VENTOLIN HFA) 108 (90 Base) MCG/ACT inhaler Inhale 2 puffs into the lungs every 6 (six) hours as needed for wheezing or shortness of breath.   Yes [provider]  escitalopram (LEXAPRO) 10 MG tablet Take 1 tablet (10 mg total) by mouth daily. 12/19/19  Yes Hendricks Limes F, FNP  ibuprofen (ADVIL) 200 MG tablet Take 400-800 mg by mouth every 8 (eight) hours as needed (for moderate to severe headaches).    Yes [provider]  Multiple Vitamins-Calcium (ONE-A-DAY WOMENS FORMULA) TABS Take 1 tablet by mouth daily with breakfast.   Yes [provider]  hydrOXYzine (VISTARIL) 25 MG capsule Take 1 capsule (25 mg total) by mouth 3 (three) times daily as needed. Patient not taking: Reported on 01/03/2020 11/20/19   Flora Lipps, MD    Allergies    Latex  Review of Systems   Review of Systems  All other systems reviewed and are negative.   Physical Exam Updated Vital Signs BP 106/71   Pulse 71   Temp (!) 97.5 F (36.4 C) (Tympanic)   Resp 17   Ht '5\' 4"'  (1.626 m)   Wt  48.1 kg   LMP 12/12/2019 (Exact Date)   SpO2 100%   BMI 18.19 kg/m   Physical Exam Vitals and nursing note reviewed. Exam conducted with a chaperone present.  Constitutional:      General: She is not in acute distress.    Appearance: Normal appearance.  HENT:     Head: Normocephalic and atraumatic.  Eyes:     General: No scleral icterus.    Conjunctiva/sclera: Conjunctivae normal.  Cardiovascular:     Rate and Rhythm: Normal rate and regular rhythm.     Pulses: Normal pulses.     Heart sounds: Normal heart sounds.  Pulmonary:     Effort: Pulmonary effort is normal. No respiratory distress.     Breath sounds: Normal breath sounds.     Comments: Right-sided  anterior chest wall tenderness to palpation, particularly over ribs 6 through 8.  Patient also has subscapular posterior chest wall tenderness. Abdominal:     Comments: Soft, nondistended.  Mild TTP in RUQ.  Negative Murphy sign.  No TTP elsewhere.  No guarding.  No masses appreciated.  No swelling or other overlying skin changes.  Genitourinary:    Comments: Anorectal exam is normal.  No evidence of fissure or hemorrhoids.  No masses appreciated. Skin:    General: Skin is dry.  Neurological:     Mental Status: She is alert and oriented to person, place, and time.     GCS: GCS eye subscore is 4. GCS verbal subscore is 5. GCS motor subscore is 6.  Psychiatric:        Mood and Affect: Mood normal.        Behavior: Behavior normal.        Thought Content: Thought content normal.     ED Results / Procedures / Treatments   Labs (all labs ordered are listed, but only abnormal results are displayed) Labs Reviewed  COMPREHENSIVE METABOLIC PANEL - Abnormal; Notable for the following components:      Result Value   Potassium 3.4 (*)    Total Bilirubin 1.3 (*)    All other components within normal limits  LIPASE, BLOOD  CBC  C-REACTIVE PROTEIN  SEDIMENTATION RATE  URINALYSIS, ROUTINE W REFLEX MICROSCOPIC  POC URINE PREG, ED  POC OCCULT BLOOD, ED    EKG None  Radiology No results found.  Procedures Procedures (including critical care time)  Medications Ordered in ED Medications  sodium chloride flush (NS) 0.9 % injection 3 mL (3 mLs Intravenous Not Given 01/03/20 2001)  potassium chloride SA (KLOR-CON) CR tablet 20 mEq (has no administration in time range)    ED Course  I have reviewed the triage vital signs and the nursing notes.  Pertinent labs & imaging results that were available during my care of the patient were reviewed by me and considered in my medical decision making (see chart for details).    MDM Rules/Calculators/A&P                      POC fecal occult  blood test was negative.  CMP reassuring with exception of mild hypokalemia to 3.4.  Will replace with 20 mEq K-Dur here in the ED and have her follow-up with her primary care provider for repeat labs.  Her liver enzymes were normal as was her alkaline phosphatase and lipase.  CBC demonstrates no abnormalities.  Obtained ESR/CRP to assess for possible inflammatory bowel disease as contributing factor for her BRBPR and reported excessive bowel gas.  We will obtain RUQ ultrasound given patient's RUQ abdominal discomfort, particularly postprandial, in addition to her pale stools and positive Boas sign (radiation to right subscapular region).  Given patient's moderate anterior chest wall discomfort, I suspect this is musculoskeletal due to her recent exercise if ultrasound is negative.  On reexamination, patient is requesting that she goes home.  She feels well.  She suspects that her diminished appetite may be at least partially attributable to starting a new SSRI medication at time of symptom onset.  She denies any nausea at this time and denies any prior vomiting.  Repeat right upper quadrant abdominal exam is more benign however she continues to endorse tenderness over anterior chest wall.  Recommending naproxen as needed for her discomfort.  Low suspicion for fracture at this time and discussed with her how DG rib series low sensitivity.   Patient reports that she already has naproxen as well as Zofran at home.  She does appear to be an anxious person which I also believe may be contributing to her symptoms.  That said, she will follow up with outpatient ultrasound of right upper quadrant tomorrow to assess for cholelithiasis for cholecystitis.  Her liver enzymes were normal.  Usually she will decision making with patient to determine whether we should proceed with CT abdomen pelvis and we both do not feel as though warranted at this time.  She feels prepared to go home.  UA was negative for any concerning  infection.  She is hemodynamically stable and neurovascular intact.  Her vital signs are within normal limits her lab work has all been reassuring.  Safe for discharge.  Strict return precautions discussed.  All of the evaluation and work-up results were discussed with the patient and any family at bedside. They were provided opportunity to ask any additional questions and have none at this time. They have expressed understanding of verbal discharge instructions as well as return precautions and are agreeable to the plan.    Final Clinical Impression(s) / ED Diagnoses Final diagnoses:  Postprandial RUQ pain  Right-sided chest wall pain    Rx / DC Orders ED Discharge Orders    None       Corena Herter, PA-C 01/03/20 2224    Daleen Bo, MD 01/04/20 [phone]

## 2020-01-30 ENCOUNTER — Encounter: Payer: Self-pay | Admitting: Family Medicine

## 2020-01-30 ENCOUNTER — Encounter: Payer: BC Managed Care – PPO | Admitting: Family Medicine

## 2020-01-30 DIAGNOSIS — F419 Anxiety disorder, unspecified: Secondary | ICD-10-CM

## 2020-01-30 NOTE — Progress Notes (Signed)
Unable to reach patient after multiple attempts

## 2020-02-03 ENCOUNTER — Ambulatory Visit: Payer: BC Managed Care – PPO | Admitting: Neurology

## 2020-02-12 ENCOUNTER — Encounter: Payer: Self-pay | Admitting: Family Medicine

## 2020-02-12 ENCOUNTER — Telehealth (INDEPENDENT_AMBULATORY_CARE_PROVIDER_SITE_OTHER): Payer: BC Managed Care – PPO | Admitting: Family Medicine

## 2020-02-12 DIAGNOSIS — F411 Generalized anxiety disorder: Secondary | ICD-10-CM | POA: Diagnosis not present

## 2020-02-12 MED ORDER — ESCITALOPRAM OXALATE 10 MG PO TABS: 10.0000 mg | ORAL_TABLET | Freq: Every day | ORAL | 2 refills | Status: DC

## 2020-02-12 NOTE — Progress Notes (Signed)
Virtual Visit via Video note  I connected with Natalie Simmons on 02/12/20 at 1:35 PM by video and verified that I am speaking with the correct person using two identifiers. Natalie Simmons is currently located at home and nobody is currently with her during visit. The provider, Gwenlyn Fudge, FNP is located in their home at time of visit.  I discussed the limitations, risks, security and privacy concerns of performing an evaluation and management service by video and the availability of in person appointments. I also discussed with the patient that there may be a patient responsible charge related to this service. The patient expressed understanding and agreed to proceed.  Subjective: PCP: Gwenlyn Fudge, FNP  Chief Complaint  Patient presents with  . Anxiety   Patient here for follow-up of her anxiety.  She was started on Lexapro 10 mg once daily on 12/19/2019.  She was taken out of work at that time due to seizure-like activity.  She did see Dr. Terrace Arabia, neurologist, on 01/02/2020 at which time it was felt her symptoms were related to her anxiety as an extensive evaluation was essentially normal.  Patient reports today she is doing great.  She has had no further seizure-like activity and her anxiety is well controlled.  She is in need of having a form completed so that she can return to work.  GAD 7 : Generalized Anxiety Score 02/12/2020 12/19/2019  Nervous, Anxious, on Edge 0 3  Control/stop worrying 0 3  Worry too much - different things 0 3  Trouble relaxing 0 3  Restless 0 3  Easily annoyed or irritable 1 3  Afraid - awful might happen 0 3  Total GAD 7 Score 1 21  Anxiety Difficulty Not difficult at all Very difficult    ROS: Per HPI  Current Outpatient Medications:  .  albuterol (VENTOLIN HFA) 108 (90 Base) MCG/ACT inhaler, Inhale 2 puffs into the lungs every 6 (six) hours as needed for wheezing or shortness of breath., Disp: , Rfl:  .  escitalopram (LEXAPRO) 10 MG tablet, Take 1  tablet (10 mg total) by mouth daily., Disp: 90 tablet, Rfl: 2 .  ibuprofen (ADVIL) 200 MG tablet, Take 400-800 mg by mouth every 8 (eight) hours as needed (for moderate to severe headaches). , Disp: , Rfl:  .  Multiple Vitamins-Calcium (ONE-A-DAY WOMENS FORMULA) TABS, Take 1 tablet by mouth daily with breakfast., Disp: , Rfl:   Allergies  Allergen Reactions  . Latex Itching   Past Medical History:  Diagnosis Date  . Anxiety   . Asthma   . Seizure-like activity ([facility])    prior to treatment of anxiety; resolved with treatment    Observations/Objective: Physical Exam Constitutional:      General: She is not in acute distress.    Appearance: Normal appearance. She is not ill-appearing or toxic-appearing.  Eyes:     General: No scleral icterus.       Right eye: No discharge.        Left eye: No discharge.     Conjunctiva/sclera: Conjunctivae normal.  Pulmonary:     Effort: Pulmonary effort is normal. No respiratory distress.  Neurological:     Mental Status: She is alert and oriented to person, place, and time.  Psychiatric:        Attention and Perception: Attention and perception normal.        Mood and Affect: Mood and affect normal.        Speech: Speech normal.  Behavior: Behavior normal. Behavior is cooperative.        Thought Content: Thought content normal.        Cognition and Memory: Cognition and memory normal.        Judgment: Judgment normal.    Assessment and Plan: 1. Generalized anxiety disorder - Well controlled on current regimen.  Patient will drop the form off at the office that she needs completed.  I am working remotely today and will complete when I return to the office.  We will write that she can return to work on Monday, March 22nd.  - escitalopram (LEXAPRO) 10 MG tablet; Take 1 tablet (10 mg total) by mouth daily.  Dispense: 90 tablet; Refill: 2   Follow Up Instructions:   I discussed the assessment and treatment plan with the patient. The  patient was provided an opportunity to ask questions and all were answered. The patient agreed with the plan and demonstrated an understanding of the instructions.   The patient was advised to call back or seek an in-person evaluation if the symptoms worsen or if the condition fails to improve as anticipated.  The above assessment and management plan was discussed with the patient. The patient verbalized understanding of and has agreed to the management plan. Patient is aware to call the clinic if symptoms persist or worsen. Patient is aware when to return to the clinic for a follow-up visit. Patient educated on when it is appropriate to go to the emergency department.   Time call ended: 1:45 PM  I provided 12 minutes of face-to-face time during this encounter.   Hendricks Limes, MSN, APRN, FNP-C [facility] 02/12/20

## 2021-06-25 DIAGNOSIS — Z20822 Contact with and (suspected) exposure to covid-19: Secondary | ICD-10-CM | POA: Diagnosis not present

## 2023-06-27 DIAGNOSIS — B356 Tinea cruris: Secondary | ICD-10-CM | POA: Diagnosis not present

## 2023-08-10 DIAGNOSIS — J45901 Unspecified asthma with (acute) exacerbation: Secondary | ICD-10-CM | POA: Diagnosis not present

## 2023-11-01 DIAGNOSIS — M79641 Pain in right hand: Secondary | ICD-10-CM | POA: Diagnosis not present

## 2023-11-01 DIAGNOSIS — R319 Hematuria, unspecified: Secondary | ICD-10-CM | POA: Diagnosis not present

## 2023-11-10 ENCOUNTER — Ambulatory Visit: Payer: BC Managed Care – PPO | Admitting: Family Medicine

## 2023-11-10 ENCOUNTER — Encounter: Payer: Self-pay | Admitting: Family Medicine

## 2023-11-10 VITALS — BP 109/71 | HR 87 | Temp 98.7°F | Ht 64.0 in | Wt 102.0 lb

## 2023-11-10 DIAGNOSIS — B379 Candidiasis, unspecified: Secondary | ICD-10-CM | POA: Diagnosis not present

## 2023-11-10 DIAGNOSIS — M79641 Pain in right hand: Secondary | ICD-10-CM | POA: Diagnosis not present

## 2023-11-10 DIAGNOSIS — E876 Hypokalemia: Secondary | ICD-10-CM | POA: Diagnosis not present

## 2023-11-10 DIAGNOSIS — R7309 Other abnormal glucose: Secondary | ICD-10-CM | POA: Diagnosis not present

## 2023-11-10 DIAGNOSIS — B070 Plantar wart: Secondary | ICD-10-CM

## 2023-11-10 DIAGNOSIS — Z136 Encounter for screening for cardiovascular disorders: Secondary | ICD-10-CM | POA: Diagnosis not present

## 2023-11-10 DIAGNOSIS — R3589 Other polyuria: Secondary | ICD-10-CM | POA: Diagnosis not present

## 2023-11-10 DIAGNOSIS — R6889 Other general symptoms and signs: Secondary | ICD-10-CM | POA: Diagnosis not present

## 2023-11-10 DIAGNOSIS — B372 Candidiasis of skin and nail: Secondary | ICD-10-CM

## 2023-11-10 DIAGNOSIS — F419 Anxiety disorder, unspecified: Secondary | ICD-10-CM

## 2023-11-10 DIAGNOSIS — R636 Underweight: Secondary | ICD-10-CM

## 2023-11-10 LAB — LIPID PANEL

## 2023-11-10 LAB — BAYER DCA HB A1C WAIVED: HB A1C (BAYER DCA - WAIVED): 5.1 % (ref 4.8–5.6)

## 2023-11-10 MED ORDER — FLUCONAZOLE 150 MG PO TABS: ORAL_TABLET | ORAL | 0 refills | Status: DC

## 2023-11-10 MED ORDER — KETOCONAZOLE 2 % EX CREA: TOPICAL_CREAM | Freq: Two times a day (BID) | CUTANEOUS | 0 refills | Status: DC

## 2023-11-10 NOTE — Progress Notes (Unsigned)
New Patient Office Visit  Subjective   Patient ID: Natalie Simmons, female    DOB: [birth year]  Age: 22 y.o. MRN: [id number]  CC:  Chief Complaint  Patient presents with   New Patient (Initial Visit)    Establish care Rash groin Recent urgent care visit negative uti (some blood in urine)   HPI Natalie Simmons presents to establish care States that she has a wart on pinky toe of Right foot  States that she has tried wart bandaids and it makes it worse  5 months  States that it itches, burns, numb, feels like a rock  Right hand pinky  States that she went to urgent care last week  Xray at [facility]  3 months  Worse in the mornings  Cannot make a fist or open  Sensitive to touch. 5/10 right now  9/10 in mornings  No trauma  Desk job   States that she was having bleeding from her urethra States that she did not have any pain  Endorses some side pain on her right pain  July was last sexual encounter, same sex   Rash on labia for 2 years  "Aggressive"  Burns, itches  All the time  UC did not look at it.  Tried nystatin cream from previous UC, which helped for a week.  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   Multiple Vitamins-Calcium (ONE-A-DAY WOMENS FORMULA) TABS Take 1 tablet by mouth daily with breakfast.   [DISCONTINUED] escitalopram (LEXAPRO) 10 MG tablet Take 1 tablet (10 mg total) by mouth daily.   [DISCONTINUED] ibuprofen (ADVIL) 200 MG tablet Take 400-800 mg by mouth every 8 (eight) hours as needed (for moderate to severe headaches).    No facility-administered encounter medications on file as of 11/10/2023.    Past Medical History:  Diagnosis Date   Anxiety    Asthma     Seizure-like activity (HCC)    prior to treatment of anxiety; resolved with treatment    Past Surgical History:  Procedure Laterality Date   WISDOM TOOTH EXTRACTION      Family History  Problem Relation Age of Onset   Drug abuse Mother    Drug abuse Father    Thyroid disease Father    Kidney cancer Maternal Grandmother    COPD Maternal Grandfather    Thyroid disease Paternal Grandmother    Heart disease Paternal Grandmother    Diabetes Paternal Grandfather     Social History   Socioeconomic History   Marital status: Single    Spouse name: Not on file   Number of children: 0   Years of education: 12   Highest education level: High school graduate  Occupational History   Occupation: Lexicographer  Tobacco Use   Smoking status: Never   Smokeless tobacco: Never  Substance and Sexual Activity   Alcohol use: No   Drug use: No   Sexual activity: Not on file  Other Topics Concern   Not on file  Social History Narrative   Lives at home with roommate.   Right-handed.   Caffeine use: 3-4 cups per day.   Social Drivers of Corporate investment banker Strain: Not on  file  Food Insecurity: Not on file  Transportation Needs: Not on file  Physical Activity: Not on file  Stress: Not on file  Social Connections: Not on file  Intimate Partner Violence: Not on file    ROS As per HPI    Objective   BP 109/71   Pulse 87   Temp 98.7 F (37.1 C)   Ht 5\' 4"  (1.626 m)   Wt 102 lb (46.3 kg)   LMP 10/05/2023 (Approximate)   SpO2 99%   BMI 17.51 kg/m   Physical Exam   Assessment & Plan:   The above assessment and management plan was discussed with the patient. The patient verbalized understanding of and has agreed to the management plan using shared-decision making. Patient is aware to call the clinic if they develop any new symptoms or if symptoms fail to improve or worsen. Patient is aware when to return to the clinic for a follow-up visit. Patient educated on  when it is appropriate to go to the emergency department.   No follow-ups on file.   Neale Burly, DNP-FNP [facility] [address] [place], [place] [address] [phone]

## 2023-11-11 LAB — CMP14+EGFR
ALT: 9 IU/L (ref 0–32)
AST: 16 IU/L (ref 0–40)
Albumin: 4.7 g/dL (ref 4.0–5.0)
Alkaline Phosphatase: 52 IU/L (ref 44–121)
BUN/Creatinine Ratio: 9 (ref 9–23)
BUN: 7 mg/dL (ref 6–20)
Bilirubin Total: 0.6 mg/dL (ref 0.0–1.2)
CO2: 25 mmol/L (ref 20–29)
Calcium: 9.6 mg/dL (ref 8.7–10.2)
Chloride: 103 mmol/L (ref 96–106)
Creatinine, Ser: 0.82 mg/dL (ref 0.57–1.00)
Globulin, Total: 2.3 g/dL (ref 1.5–4.5)
Glucose: 98 mg/dL (ref 70–99)
Potassium: 4.1 mmol/L (ref 3.5–5.2)
Sodium: 139 mmol/L (ref 134–144)
Total Protein: 7 g/dL (ref 6.0–8.5)
eGFR: 102 mL/min/{1.73_m2} (ref >59)

## 2023-11-11 LAB — CBC WITH DIFFERENTIAL/PLATELET
Basophils Absolute: 0 10*3/uL (ref 0.0–0.2)
Basos: 1 %
EOS (ABSOLUTE): 0 10*3/uL (ref 0.0–0.4)
Eos: 1 %
Hematocrit: 39.2 % (ref 34.0–46.6)
Hemoglobin: 13 g/dL (ref 11.1–15.9)
Immature Grans (Abs): 0 10*3/uL (ref 0.0–0.1)
Immature Granulocytes: 0 %
Lymphocytes Absolute: 0.9 10*3/uL (ref 0.7–3.1)
Lymphs: 15 %
MCH: 31.3 pg (ref 26.6–33.0)
MCHC: 33.2 g/dL (ref 31.5–35.7)
MCV: 95 fL (ref 79–97)
Monocytes Absolute: 0.3 10*3/uL (ref 0.1–0.9)
Monocytes: 4 %
Neutrophils Absolute: 4.8 10*3/uL (ref 1.4–7.0)
Neutrophils: 79 %
Platelets: 258 10*3/uL (ref 150–450)
RBC: 4.15 x10E6/uL (ref 3.77–5.28)
RDW: 11.6 % — ABNORMAL LOW (ref 11.7–15.4)
WBC: 6.1 10*3/uL (ref 3.4–10.8)

## 2023-11-11 LAB — LIPID PANEL
Cholesterol, Total: 152 mg/dL (ref 100–199)
HDL: 62 mg/dL (ref >39)
LDL CALC COMMENT:: 2.5 ratio (ref 0.0–4.4)
LDL Chol Calc (NIH): 76 mg/dL (ref 0–99)
Triglycerides: 71 mg/dL (ref 0–149)
VLDL Cholesterol Cal: 14 mg/dL (ref 5–40)

## 2023-11-11 LAB — VITAMIN D 25 HYDROXY (VIT D DEFICIENCY, FRACTURES): Vit D, 25-Hydroxy: 25.4 ng/mL — ABNORMAL LOW (ref 30.0–100.0)

## 2023-11-11 LAB — TSH: TSH: 1.71 u[IU]/mL (ref 0.450–4.500)

## 2023-11-13 NOTE — Progress Notes (Signed)
Slight variation on CBC, not concerning at this time. Vitamin D low. Recommend daily supplementation with [id number] international units

## 2023-11-16 DIAGNOSIS — F411 Generalized anxiety disorder: Secondary | ICD-10-CM | POA: Diagnosis not present

## 2023-11-17 ENCOUNTER — Ambulatory Visit: Payer: BC Managed Care – PPO | Admitting: Podiatry

## 2023-11-17 DIAGNOSIS — B070 Plantar wart: Secondary | ICD-10-CM | POA: Diagnosis not present

## 2023-11-17 NOTE — Progress Notes (Signed)
  Subjective:  Patient ID: Natalie Simmons, female    DOB: 06-22-2001,  MRN: [id number]  Chief Complaint  Patient presents with   Plantar Warts    Pt presents for a plantar wart on the right foot  that she had for a year now and it starting to hurt when she walks.    22 y.o. female presents with the above complaint.  Patient presents with complaint of right submetatarsal 4 plantar verruca.  She states been present for quite some time is progressive and worse worse with ambulation worse with pressure she wants to get it evaluated.  Hurts when ambulating.  Pain scale is 5 out of 10 dull aching nature   Review of Systems: Negative except as noted in the HPI. Denies N/V/F/Ch.  Past Medical History:  Diagnosis Date   Anxiety    Asthma    Seizure-like activity (HCC)    prior to treatment of anxiety; resolved with treatment    Current Outpatient Medications:    albuterol (VENTOLIN HFA) 108 (90 Base) MCG/ACT inhaler, Inhale 2 puffs into the lungs every 6 (six) hours as needed for wheezing or shortness of breath., Disp: , Rfl:    fluconazole (DIFLUCAN) 150 MG tablet, 1 po q week x 4 weeks, Disp: 4 tablet, Rfl: 0   ketoconazole (NIZORAL) 2 % cream, Apply topically 2 (two) times daily. Apply 0.25 g per dose, Disp: 15 g, Rfl: 0   Multiple Vitamins-Calcium (ONE-A-DAY WOMENS FORMULA) TABS, Take 1 tablet by mouth daily with breakfast., Disp: , Rfl:   Social History   Tobacco Use  Smoking Status Never  Smokeless Tobacco Never    Allergies  Allergen Reactions   Latex Itching   Objective:  There were no vitals filed for this visit. There is no height or weight on file to calculate BMI. Constitutional Well developed. Well nourished.  Vascular Dorsalis pedis pulses palpable bilaterally. Posterior tibial pulses palpable bilaterally. Capillary refill normal to all digits.  No cyanosis or clubbing noted. Pedal hair growth normal.  Neurologic Normal speech. Oriented to person, place, and  time. Epicritic sensation to light touch grossly present bilaterally.  Dermatologic Right submetatarsal 4 plantar verruca pain on palpation pinpoint bleeding noted upon debridement  Orthopedic: Normal joint ROM without pain or crepitus bilaterally. No visible deformities. No bony tenderness.   Radiographs: None Assessment:   1. Plantar verruca    Plan:  Patient was evaluated and treated and all questions answered.  Right submet 4 plantar verruca -All questions and concerns were discussed with the patient in extensive detail --Lesion was debrided today without complications. Hemostasis was achieved and the area was cleaned. Cantharone was applied followed by an occlusive bandage. Post procedure complications were discussed. Monitor for signs or symptoms of infection and directed to call the office mainly should any occur.   No follow-ups on file.

## 2023-12-13 ENCOUNTER — Encounter: Payer: Self-pay | Admitting: Family Medicine

## 2023-12-13 ENCOUNTER — Ambulatory Visit: Payer: BC Managed Care – PPO | Admitting: Family Medicine

## 2023-12-13 ENCOUNTER — Ambulatory Visit (INDEPENDENT_AMBULATORY_CARE_PROVIDER_SITE_OTHER): Payer: BC Managed Care – PPO

## 2023-12-13 VITALS — BP 108/72 | HR 94 | Temp 98.9°F | Ht 64.0 in | Wt 97.0 lb

## 2023-12-13 DIAGNOSIS — K5900 Constipation, unspecified: Secondary | ICD-10-CM | POA: Diagnosis not present

## 2023-12-13 DIAGNOSIS — R195 Other fecal abnormalities: Secondary | ICD-10-CM

## 2023-12-13 DIAGNOSIS — R1011 Right upper quadrant pain: Secondary | ICD-10-CM

## 2023-12-13 DIAGNOSIS — R112 Nausea with vomiting, unspecified: Secondary | ICD-10-CM | POA: Diagnosis not present

## 2023-12-13 DIAGNOSIS — R21 Rash and other nonspecific skin eruption: Secondary | ICD-10-CM | POA: Diagnosis not present

## 2023-12-13 DIAGNOSIS — R636 Underweight: Secondary | ICD-10-CM

## 2023-12-13 DIAGNOSIS — I878 Other specified disorders of veins: Secondary | ICD-10-CM | POA: Diagnosis not present

## 2023-12-13 MED ORDER — TRIAMCINOLONE ACETONIDE 0.025 % EX OINT: 1.0000 | TOPICAL_OINTMENT | Freq: Two times a day (BID) | CUTANEOUS | 0 refills | Status: AC

## 2023-12-13 NOTE — Progress Notes (Signed)
 Subjective:  Patient ID: Natalie Simmons, female    DOB: 2001/09/27, 23 y.o.   MRN: [id number]  Patient Care Team: Chrystine Crate, FNP as PCP - General (Family Medicine)   Chief Complaint:  rash (Rash f/u/Right abdominal pain just below rib cage/Unwanted weight loss/)  HPI: Natalie Simmons is a 23 y.o. female presenting on 12/13/2023 for rash (Rash f/u/Right abdominal pain just below rib cage/Unwanted weight loss/)  Rash  States that it continues to itch. She is having issues with dryness and flakiness. Diflucan  did not help her symptoms. States that she is not adding additional creams or lotions. Reports that she is wearing plan, cotton underwear. She has not changed detergents or soaps. Started 2 years ago. States that she has tried "everything": monistat, nystatin, hydrocortisone cream, A&D, desitin   Pain in abdomen  Pain in RUQ. States that sometimes it radiates to her back. "Feels tight, like something is pushing"  Started 2 weeks ago. States that urine has been "bubbly". States that it smells "like popcorn".  States that stool has yellow mucus or is dark brown/tarry. States that she is having some constipation in the last 2 weeks. States that she went 3 days without bowel movement. States that now she is having more diarrhea. Trying to eat better. States that she has been nauseous for the last two weeks and throws up daily in the morning. States that everything makes her gassy and she has to use the bathroom immediately after eating. States that similar symptoms occurred years ago and she completed HIDA scan. It was normal at that time, so she was told she had a "gallbladder attack"   Plantar wart  States that she has continued warts, now has 3 warts on her foot. She is planning to follow up with podiatry.   Relevant past medical, surgical, family, and social history reviewed and updated as indicated.  Allergies and medications reviewed and updated. Data reviewed: Chart in  Epic.  Past Medical History:  Diagnosis Date   Anxiety    Asthma    Seizure-like activity (HCC)    prior to treatment of anxiety; resolved with treatment   Past Surgical History:  Procedure Laterality Date   WISDOM TOOTH EXTRACTION      Social History   Socioeconomic History   Marital status: Single    Spouse name: Not on file   Number of children: 0   Years of education: 12   Highest education level: High school graduate  Occupational History   Occupation: Lexicographer  Tobacco Use   Smoking status: Never   Smokeless tobacco: Never  Substance and Sexual Activity   Alcohol use: No   Drug use: No   Sexual activity: Not on file  Other Topics Concern   Not on file  Social History Narrative   Lives at home with roommate.   Right-handed.   Caffeine use: 3-4 cups per day.   Social Drivers of Corporate investment banker Strain: Not on file  Food Insecurity: Not on file  Transportation Needs: Not on file  Physical Activity: Not on file  Stress: Not on file  Social Connections: Not on file  Intimate Partner Violence: Not on file    Outpatient Encounter Medications as of 12/13/2023  Medication Sig   albuterol  (VENTOLIN  HFA) 108 (90 Base) MCG/ACT inhaler Inhale 2 puffs into the lungs every 6 (six) hours as needed for wheezing or shortness of breath.   Multiple Vitamins-Calcium (ONE-A-DAY WOMENS FORMULA) TABS Take  1 tablet by mouth daily with breakfast.   [DISCONTINUED] ketoconazole  (NIZORAL ) 2 % cream Apply topically 2 (two) times daily. Apply 0.25 g per dose   [DISCONTINUED] fluconazole  (DIFLUCAN ) 150 MG tablet 1 po q week x 4 weeks (Patient not taking: Reported on 12/13/2023)   No facility-administered encounter medications on file as of 12/13/2023.    Allergies  Allergen Reactions   Latex Itching    Review of Systems As per HPI Objective:  BP 108/72 (BP Location: Right Arm, Patient Position: Sitting, Cuff Size: Small)   Pulse 94   Temp 98.9 F (37.2 C)    Ht 5\' 4"  (1.626 m)   Wt 97 lb (44 kg)   LMP 12/07/2023   SpO2 97%   BMI 16.65 kg/m    Wt Readings from Last 3 Encounters:  12/13/23 97 lb (44 kg)  11/10/23 102 lb (46.3 kg)  01/03/20 106 lb (48.1 kg)   Physical Exam Constitutional:      General: She is awake. She is not in acute distress.    Appearance: Normal appearance. She is well-developed and well-groomed. She is not ill-appearing, toxic-appearing or diaphoretic.  Cardiovascular:     Rate and Rhythm: Normal rate and regular rhythm.     Pulses: Normal pulses.          Radial pulses are 2+ on the right side and 2+ on the left side.       Posterior tibial pulses are 2+ on the right side and 2+ on the left side.     Heart sounds: Normal heart sounds. No murmur heard.    No gallop.  Pulmonary:     Effort: Pulmonary effort is normal. No respiratory distress.     Breath sounds: Normal breath sounds. No stridor. No wheezing, rhonchi or rales.  Abdominal:     General: Abdomen is flat.     Palpations: Abdomen is soft.     Tenderness: There is abdominal tenderness in the right upper quadrant. There is guarding and rebound.     Hernia: No hernia is present.  Genitourinary:    Comments: Erythematous, dry, flaky, swollen, right labia Musculoskeletal:     Cervical back: Full passive range of motion without pain and neck supple.     Right lower leg: No edema.     Left lower leg: No edema.  Skin:    General: Skin is warm.     Capillary Refill: Capillary refill takes less than 2 seconds.  Neurological:     General: No focal deficit present.     Mental Status: She is alert, oriented to person, place, and time and easily aroused. Mental status is at baseline.     GCS: GCS eye subscore is 4. GCS verbal subscore is 5. GCS motor subscore is 6.     Motor: No weakness.  Psychiatric:        Attention and Perception: Attention and perception normal.        Mood and Affect: Mood and affect normal.        Speech: Speech normal.         Behavior: Behavior normal. Behavior is cooperative.        Thought Content: Thought content normal. Thought content does not include homicidal or suicidal ideation. Thought content does not include homicidal or suicidal plan.        Cognition and Memory: Cognition and memory normal.        Judgment: Judgment normal.     Results for orders placed  or performed in visit on 11/10/23  Bayer DCA Hb A1c Waived   Collection Time: 11/10/23  3:48 PM  Result Value Ref Range   HB A1C (BAYER DCA - WAIVED) 5.1 4.8 - 5.6 %  CBC with Differential/Platelet   Collection Time: 11/10/23  3:52 PM  Result Value Ref Range   WBC 6.1 3.4 - 10.8 x10E3/uL   RBC 4.15 3.77 - 5.28 x10E6/uL   Hemoglobin 13.0 11.1 - 15.9 g/dL   Hematocrit 86.5 78.4 - 46.6 %   MCV 95 79 - 97 fL   MCH 31.3 26.6 - 33.0 pg   MCHC 33.2 31.5 - 35.7 g/dL   RDW 69.6 (L) 29.5 - 28.4 %   Platelets 258 150 - 450 x10E3/uL   Neutrophils 79 Not Estab. %   Lymphs 15 Not Estab. %   Monocytes 4 Not Estab. %   Eos 1 Not Estab. %   Basos 1 Not Estab. %   Neutrophils Absolute 4.8 1.4 - 7.0 x10E3/uL   Lymphocytes Absolute 0.9 0.7 - 3.1 x10E3/uL   Monocytes Absolute 0.3 0.1 - 0.9 x10E3/uL   EOS (ABSOLUTE) 0.0 0.0 - 0.4 x10E3/uL   Basophils Absolute 0.0 0.0 - 0.2 x10E3/uL   Immature Granulocytes 0 Not Estab. %   Immature Grans (Abs) 0.0 0.0 - 0.1 x10E3/uL  CMP14+EGFR   Collection Time: 11/10/23  3:52 PM  Result Value Ref Range   Glucose 98 70 - 99 mg/dL   BUN 7 6 - 20 mg/dL   Creatinine, Ser 1.32 0.57 - 1.00 mg/dL   eGFR 440 >10 UV/OZD/6.64   BUN/Creatinine Ratio 9 9 - 23   Sodium 139 134 - 144 mmol/L   Potassium 4.1 3.5 - 5.2 mmol/L   Chloride 103 96 - 106 mmol/L   CO2 25 20 - 29 mmol/L   Calcium 9.6 8.7 - 10.2 mg/dL   Total Protein 7.0 6.0 - 8.5 g/dL   Albumin 4.7 4.0 - 5.0 g/dL   Globulin, Total 2.3 1.5 - 4.5 g/dL   Bilirubin Total 0.6 0.0 - 1.2 mg/dL   Alkaline Phosphatase 52 44 - 121 IU/L   AST 16 0 - 40 IU/L   ALT 9 0 - 32  IU/L  Lipid panel   Collection Time: 11/10/23  3:52 PM  Result Value Ref Range   Cholesterol, Total 152 100 - 199 mg/dL   Triglycerides 71 0 - 149 mg/dL   HDL 62 >40 mg/dL   VLDL Cholesterol Cal 14 5 - 40 mg/dL   LDL Chol Calc (NIH) 76 0 - 99 mg/dL   Chol/HDL Ratio 2.5 0.0 - 4.4 ratio  TSH   Collection Time: 11/10/23  3:52 PM  Result Value Ref Range   TSH 1.710 0.450 - 4.500 uIU/mL  VITAMIN D  25 Hydroxy (Vit-D Deficiency, Fractures)   Collection Time: 11/10/23  3:52 PM  Result Value Ref Range   Vit D, 25-Hydroxy 25.4 (L) 30.0 - 100.0 ng/mL       12/13/2023    3:39 PM 11/10/2023    2:57 PM 12/19/2019   10:13 AM  Depression screen PHQ 2/9  Decreased Interest 1 1   Down, Depressed, Hopeless 1 1 0  PHQ - 2 Score 2 2 0  Altered sleeping 1 1 3   Tired, decreased energy 1 1 0  Change in appetite 2 2 1   Feeling bad or failure about yourself  1 0 1  Trouble concentrating 0 1 0  Moving slowly or fidgety/restless 1  0  Suicidal thoughts 0 0 0  PHQ-9 Score 8 7 5   Difficult doing work/chores Somewhat difficult Not difficult at all Not difficult at all       12/13/2023    3:40 PM 11/10/2023    2:58 PM 02/12/2020    1:41 PM 12/19/2019   10:14 AM  GAD 7 : Generalized Anxiety Score  Nervous, Anxious, on Edge 1 1 0 3  Control/stop worrying 1 0 0 3  Worry too much - different things 1 0 0 3  Trouble relaxing 1 1 0 3  Restless 1 1 0 3  Easily annoyed or irritable 1 0 1 3  Afraid - awful might happen 0 0 0 3  Total GAD 7 Score 6 3 1 21   Anxiety Difficulty Somewhat difficult Not difficult at all Not difficult at all Very difficult   Pertinent labs & imaging results that were available during my care of the patient were reviewed by me and considered in my medical decision making.  Assessment & Plan:  Luzma was seen today for rash.  Diagnoses and all orders for this visit:  Rash Will start cream as below. Referral placed for additional evaluation. Discussed that rash is similar  in nature to psoriasis, however, patient does not have any additional patches.   -     triamcinolone  (KENALOG ) 0.025 % ointment; Apply 1 Application topically 2 (two) times daily. -     Ambulatory referral to Dermatology  Right upper quadrant abdominal pain Imaging and Labs as below. Will communicate results to patient once available. Will await results to determine next steps. Discussed with patient that she may need to follow up with specialty.  -     DG Abd 1 View -     Urine Culture -     Urinalysis, Routine w reflex microscopic -     Lipase -     Amylase -     Celiac Disease Panel -     CBC with Differential/Platelet -     CMP14+EGFR -     C-reactive protein  Low weight Encouraged patient to increase dietary intake. Labs as above.   Mucus in stool Labs as below. Will communicate results to patient once available. Will await results to determine next steps. Discussed with patient that she may need to follow up with specialty.  -     Lipase -     Amylase -     Celiac Disease Panel -     CBC with Differential/Platelet -     CMP14+EGFR -     C-reactive protein  Nausea and vomiting, unspecified vomiting type As above.  -     Lipase -     Amylase -     Celiac Disease Panel -     CBC with Differential/Platelet -     CMP14+EGFR -     C-reactive protein   Continue all other maintenance medications.  Follow up plan: Return if symptoms worsen or fail to improve.   Continue healthy lifestyle choices, including diet (rich in fruits, vegetables, and lean proteins, and low in salt and simple carbohydrates) and exercise (at least 30 minutes of moderate physical activity daily).  Written and verbal instructions provided   The above assessment and management plan was discussed with the patient. The patient verbalized understanding of and has agreed to the management plan. Patient is aware to call the clinic if they develop any new symptoms or if symptoms persist or worsen. Patient  is aware when  to return to the clinic for a follow-up visit. Patient educated on when it is appropriate to go to the emergency department.   Jacqualyn Mates, DNP-FNP [facility] [address] [place], [place] [address] [phone]

## 2023-12-14 LAB — URINALYSIS, ROUTINE W REFLEX MICROSCOPIC
Bilirubin, UA: NEGATIVE
Glucose, UA: NEGATIVE
Ketones, UA: NEGATIVE
Leukocytes,UA: NEGATIVE
Nitrite, UA: NEGATIVE
Protein,UA: NEGATIVE
Specific Gravity, UA: 1.015 (ref 1.005–1.030)
Urobilinogen, Ur: 0.2 mg/dL (ref 0.2–1.0)
pH, UA: 6 (ref 5.0–7.5)

## 2023-12-14 LAB — URINE CULTURE

## 2023-12-15 ENCOUNTER — Encounter: Payer: Self-pay | Admitting: Family Medicine

## 2023-12-15 NOTE — Progress Notes (Signed)
Xray findings consistent with constipation. Please complete bowel cleanout and follow up if symptoms do not improve. Will send mychart message with bowel cleanout instructions.

## 2023-12-17 LAB — CBC WITH DIFFERENTIAL/PLATELET
Basophils Absolute: 0 10*3/uL (ref 0.0–0.2)
Basos: 1 %
EOS (ABSOLUTE): 0 10*3/uL (ref 0.0–0.4)
Eos: 1 %
Hematocrit: 39.2 % (ref 34.0–46.6)
Hemoglobin: 13.1 g/dL (ref 11.1–15.9)
Immature Grans (Abs): 0 10*3/uL (ref 0.0–0.1)
Immature Granulocytes: 0 %
Lymphocytes Absolute: 0.9 10*3/uL (ref 0.7–3.1)
Lymphs: 15 %
MCH: 31 pg (ref 26.6–33.0)
MCHC: 33.4 g/dL (ref 31.5–35.7)
MCV: 93 fL (ref 79–97)
Monocytes Absolute: 0.4 10*3/uL (ref 0.1–0.9)
Monocytes: 7 %
Neutrophils Absolute: 4.5 10*3/uL (ref 1.4–7.0)
Neutrophils: 76 %
Platelets: 298 10*3/uL (ref 150–450)
RBC: 4.22 x10E6/uL (ref 3.77–5.28)
RDW: 11.7 % (ref 11.7–15.4)
WBC: 5.9 10*3/uL (ref 3.4–10.8)

## 2023-12-17 LAB — CMP14+EGFR
ALT: 8 [IU]/L (ref 0–32)
AST: 18 [IU]/L (ref 0–40)
Albumin: 5.2 g/dL — ABNORMAL HIGH (ref 4.0–5.0)
Alkaline Phosphatase: 55 [IU]/L (ref 44–121)
BUN/Creatinine Ratio: 15 (ref 9–23)
BUN: 11 mg/dL (ref 6–20)
Bilirubin Total: 0.9 mg/dL (ref 0.0–1.2)
CO2: 21 mmol/L (ref 20–29)
Calcium: 10.2 mg/dL (ref 8.7–10.2)
Chloride: 101 mmol/L (ref 96–106)
Creatinine, Ser: 0.71 mg/dL (ref 0.57–1.00)
Globulin, Total: 2.5 g/dL (ref 1.5–4.5)
Glucose: 102 mg/dL — ABNORMAL HIGH (ref 70–99)
Potassium: 4.2 mmol/L (ref 3.5–5.2)
Sodium: 140 mmol/L (ref 134–144)
Total Protein: 7.7 g/dL (ref 6.0–8.5)
eGFR: 122 mL/min/{1.73_m2} (ref >59)

## 2023-12-17 LAB — CELIAC DISEASE PANEL
Endomysial IgA: NEGATIVE
IgA/Immunoglobulin A, Serum: 172 mg/dL (ref 87–352)
Transglutaminase IgA: <2 U/mL (ref 0–3)

## 2023-12-17 LAB — C-REACTIVE PROTEIN: CRP: <1 mg/L (ref 0–10)

## 2023-12-17 LAB — LIPASE: Lipase: 21 U/L (ref 14–72)

## 2023-12-17 LAB — AMYLASE: Amylase: 48 U/L (ref 31–110)

## 2023-12-19 ENCOUNTER — Encounter: Payer: Self-pay | Admitting: Family Medicine

## 2023-12-19 NOTE — Progress Notes (Signed)
Mild changes on CMP, not concerning at this time. Negative for UTI. All other labs normal.

## 2023-12-27 ENCOUNTER — Encounter: Payer: Self-pay | Admitting: Podiatry

## 2023-12-27 ENCOUNTER — Ambulatory Visit: Payer: BC Managed Care – PPO | Admitting: Podiatry

## 2023-12-27 DIAGNOSIS — B070 Plantar wart: Secondary | ICD-10-CM | POA: Diagnosis not present

## 2023-12-27 NOTE — Progress Notes (Signed)
  Subjective:  Patient ID: Natalie Simmons, female    DOB: 07-Apr-2001,  MRN: [id number]  Chief Complaint  Patient presents with   Plantar Warts    RM#13 Follow up on right foot plantar warts. Patient states doing good thinks it is gone.    23 y.o. female presents with the above complaint.  Patient presents with complaint of right forefoot plantar verruca.  She states that she is doing a lot better Cantharone therapy helped.  She denies any other acute issues.   Review of Systems: Negative except as noted in the HPI. Denies N/V/F/Ch.  Past Medical History:  Diagnosis Date   Anxiety    Asthma    Seizure-like activity (HCC)    prior to treatment of anxiety; resolved with treatment    Current Outpatient Medications:    albuterol (VENTOLIN HFA) 108 (90 Base) MCG/ACT inhaler, Inhale 2 puffs into the lungs every 6 (six) hours as needed for wheezing or shortness of breath., Disp: , Rfl:    Multiple Vitamins-Calcium (ONE-A-DAY WOMENS FORMULA) TABS, Take 1 tablet by mouth daily with breakfast., Disp: , Rfl:    triamcinolone (KENALOG) 0.025 % ointment, Apply 1 Application topically 2 (two) times daily., Disp: 80 each, Rfl: 0  Social History   Tobacco Use  Smoking Status Never  Smokeless Tobacco Never    Allergies  Allergen Reactions   Latex Itching   Objective:  There were no vitals filed for this visit. There is no height or weight on file to calculate BMI. Constitutional Well developed. Well nourished.  Vascular Dorsalis pedis pulses palpable bilaterally. Posterior tibial pulses palpable bilaterally. Capillary refill normal to all digits.  No cyanosis or clubbing noted. Pedal hair growth normal.  Neurologic Normal speech. Oriented to person, place, and time. Epicritic sensation to light touch grossly present bilaterally.  Dermatologic Right submetatarsal 4 plantar verruca pain on palpation pinpoint bleeding noted upon debridement  Orthopedic: Normal joint ROM without pain or  crepitus bilaterally. No visible deformities. No bony tenderness.   Radiographs: None Assessment:   No diagnosis found.  Plan:  Patient was evaluated and treated and all questions answered.  Right submet 4 plantar verruca~ second application  -All questions and concerns were discussed with the patient in extensive detail --Lesion was debrided today without complications. Hemostasis was achieved and the area was cleaned. Cantharone was applied followed by an occlusive bandage. Post procedure complications were discussed. Monitor for signs or symptoms of infection and directed to call the office mainly should any occur.   No follow-ups on file.

## 2024-02-08 DIAGNOSIS — L400 Psoriasis vulgaris: Secondary | ICD-10-CM | POA: Diagnosis not present

## 2024-02-21 ENCOUNTER — Ambulatory Visit: Admitting: Family Medicine

## 2024-02-21 DIAGNOSIS — M65351 Trigger finger, right little finger: Secondary | ICD-10-CM | POA: Diagnosis not present

## 2024-02-26 DIAGNOSIS — M79644 Pain in right finger(s): Secondary | ICD-10-CM | POA: Diagnosis not present

## 2024-03-07 DIAGNOSIS — D225 Melanocytic nevi of trunk: Secondary | ICD-10-CM | POA: Diagnosis not present

## 2024-03-07 DIAGNOSIS — L308 Other specified dermatitis: Secondary | ICD-10-CM | POA: Diagnosis not present

## 2024-03-07 DIAGNOSIS — Z1283 Encounter for screening for malignant neoplasm of skin: Secondary | ICD-10-CM | POA: Diagnosis not present

## 2024-03-07 DIAGNOSIS — D485 Neoplasm of uncertain behavior of skin: Secondary | ICD-10-CM | POA: Diagnosis not present

## 2024-06-03 ENCOUNTER — Ambulatory Visit: Admission: EM | Admit: 2024-06-03 | Discharge: 2024-06-03 | Disposition: A | Discharge status: Home / Self Care

## 2024-06-03 ENCOUNTER — Encounter: Payer: Self-pay | Admitting: Emergency Medicine

## 2024-06-03 DIAGNOSIS — R591 Generalized enlarged lymph nodes: Secondary | ICD-10-CM | POA: Diagnosis not present

## 2024-06-03 MED ORDER — IBUPROFEN 800 MG PO TABS: 800.0000 mg | ORAL_TABLET | Freq: Three times a day (TID) | ORAL | 0 refills | Status: DC | PRN

## 2024-06-03 MED ORDER — OXYCODONE HCL 5 MG PO TABS: 5.0000 mg | ORAL_TABLET | ORAL | 0 refills | Status: DC | PRN

## 2024-06-03 NOTE — Discharge Instructions (Addendum)
 You were seen today for a swollen lymph node in your neck that has been present for about a year but recently became painful and worsened significantly over the past 3 days. The pain is now affecting your ability to eat and swallow. You also reported low-grade fever, night sweats, weight loss, early fullness when eating, and decreased appetite. These symptoms may indicate a more serious condition, and further evaluation is needed. Blood work has been ordered to check for possible infections or inflammatory conditions.  You will only be notified if anything is abnormal; otherwise, you may view your results on MyChart. You should follow-up with an ENT specialist who may perform a biopsy to better understand the cause of the swelling.  To help with discomfort at home, you can apply moist heat using a warm washcloth or a heating pad on a low setting to the affected area. Take the prescribed motrin  up to 3 times a day as needed.  Do not take any additional NSAIDs such as ibuprofen  or Aleve with this medication.  I have also prescribed you a stronger pain medication called oxycodone  that you can take as needed for severe pain. You may also take Tylenol  (acetaminophen ) 1000 mg every six hours for additional pain relief. This equals two 500 mg tablets at a time. Be careful not to take more than 4000 mg of Tylenol  in a 24-hour period.  Stay hydrated and try to eat soft foods if chewing or swallowing becomes difficult. Monitor for any changes in your symptoms.  Follow up promptly with the ENT specialist.   Go to the emergency department if you experience worsening pain, increased swelling, high fever, difficulty breathing, trouble swallowing fluids, or if you are unable to eat or drink due to pain.

## 2024-06-03 NOTE — ED Provider Notes (Signed)
 [facility]    CSN: [id number] Arrival date & time: 06/03/24  1500      History   Chief Complaint Chief Complaint  Patient presents with   Lymph Node Concern     HPI Natalie Simmons is a 23 y.o. female.   Discussed the use of AI scribe software for clinical note transcription with the patient, who gave verbal consent to proceed.   The patient presents with a painful, enlarged lymph node in the neck that has been present for about a year but has significantly worsened over the past 3 days.   The lymph node has been slowly growing over the past year, but 3 days ago, the patient woke up with pain in the area. Yesterday, the pain intensified, making it difficult to swallow. The patient describes the sensation as swallowing a rock and reports that their whole neck feels achy and hot. The pain has progressively worsened, with the patient stating it is 10 times worse this morning compared to 3 days ago. The patient has been unable to eat properly due to the discomfort, mentioning difficulty swallowing a muffin.  Associated symptoms include low-grade fever (around 51F), night sweats, and chills. The patient reports waking up drenched in sweat and feeling freezing, necessitating changing their shirt. Body aches are limited to the neck and shoulders. The patient has experienced significant weight loss, dropping from 113 pounds to 97-93 pounds, accompanied by early satiety and decreased appetite.  The patient also reports ongoing dermatological issues, including psoriasis-like symptoms with red, flaky, bumpy skin patches mainly in the groin and on the elbows. The groin area is particularly bothersome, causing itching and pain. The patient has been seeing a dermatologist since the beginning of the year and is currently using a steroid cream for treatment. Additionally, the patient mentions having trigger finger in the left middle finger, which has recently worsened, causing the  finger to lock up.  The patient denies any recent illnesses, nausea, or vomiting. They report that over-the-counter pain medications like Advil  and Tylenol  have not provided relief for their current symptoms.  The following portions of the patient's history were reviewed and updated as appropriate: allergies, current medications, past family history, past medical history, past social history, past surgical history, and problem list.         Past Medical History:  Diagnosis Date   Anxiety    Asthma    Seizure-like activity (HCC)    prior to treatment of anxiety; resolved with treatment    Patient Active Problem List   Diagnosis Date Noted   Anxiety 01/02/2020   Hypokalemia 11/17/2019    Past Surgical History:  Procedure Laterality Date   WISDOM TOOTH EXTRACTION      OB History   No obstetric history on file.      Home Medications    Prior to Admission medications   Medication Sig Start Date End Date Taking? Authorizing Provider  fluticasone (CUTIVATE) 0.05 % cream  02/09/24  Yes [provider]  ibuprofen  (ADVIL ) 800 MG tablet Take 1 tablet (800 mg total) by mouth every 8 (eight) hours as needed (pain). Take with food to avoid stomach upset. Do not take any additional NSAIDs while on this. You may take tylenol  in addition to this if needed for extra pain relief. 06/03/24  Yes Iola Lukes, FNP  oxyCODONE  (ROXICODONE ) 5 MG immediate release tablet Take 1 tablet (5 mg total) by mouth every 4 (four) hours as needed for severe pain (pain score 7-10).  06/03/24  Yes Iola Lukes, FNP  albuterol  (VENTOLIN  HFA) 108 (90 Base) MCG/ACT inhaler Inhale 2 puffs into the lungs every 6 (six) hours as needed for wheezing or shortness of breath.    [provider]  Multiple Vitamins-Calcium (ONE-A-DAY WOMENS FORMULA) TABS Take 1 tablet by mouth daily with breakfast.    [provider]  triamcinolone  (KENALOG ) 0.025 % ointment Apply 1 Application topically 2  (two) times daily. 12/13/23   Milian, Marry Lenis, FNP    Family History Family History  Problem Relation Age of Onset   Drug abuse Mother    Drug abuse Father    Thyroid  disease Father    Kidney cancer Maternal Grandmother    COPD Maternal Grandfather    Thyroid  disease Paternal Grandmother    Heart disease Paternal Grandmother    Diabetes Paternal Grandfather     Social History Social History   Tobacco Use   Smoking status: Never    Passive exposure: Never   Smokeless tobacco: Never  Vaping Use   Vaping status: Never Used  Substance Use Topics   Alcohol use: No   Drug use: No     Allergies   Patient has no known allergies.   Review of Systems Review of Systems  Constitutional:  Positive for appetite change, chills, diaphoresis (night sweats), fever (TMAX around 99) and unexpected weight change.  HENT:  Positive for trouble swallowing (painful to swallow). Negative for sore throat.   Gastrointestinal:  Negative for nausea and vomiting.  Musculoskeletal:  Negative for neck pain and neck stiffness.       Enlarged lymph node to the right side of neck x 1 year; slow growing; painful over the past 3 days and getting worse. Also having issues with left middle finger. Has history of the same on the right hand. Has hand specialist.   Skin:  Positive for rash (patchy, red, flaky in the groin and elbows; ongoing for the past few months; currently being evaluated by dermatology).  All other systems reviewed and are negative.    Physical Exam Triage Vital Signs ED Triage Vitals  Encounter Vitals Group     BP 06/03/24 1529 111/80     Girls Systolic BP Percentile --      Girls Diastolic BP Percentile --      Boys Systolic BP Percentile --      Boys Diastolic BP Percentile --      Pulse Rate 06/03/24 1529 100     Resp 06/03/24 1529 16     Temp 06/03/24 1529 98.8 F (37.1 C)     Temp Source 06/03/24 1529 Oral     SpO2 06/03/24 1529 98 %     Weight 06/03/24 1528 97 lb  (44 kg)     Height --      Head Circumference --      Peak Flow --      Pain Score 06/03/24 1522 8     Pain Loc --      Pain Education --      Exclude from Growth Chart --    No data found.  Updated Vital Signs BP 111/80 (BP Location: Left Arm)   Pulse 100   Temp 98.8 F (37.1 C) (Oral)   Resp 16   Wt 97 lb (44 kg)   LMP 05/13/2024 (Exact Date)   SpO2 98%   BMI 16.65 kg/m   Visual Acuity Right Eye Distance:   Left Eye Distance:   Bilateral Distance:  Right Eye Near:   Left Eye Near:    Bilateral Near:     Physical Exam Vitals reviewed.  Constitutional:      General: She is awake. She is not in acute distress.    Appearance: Normal appearance. She is well-developed. She is not ill-appearing, toxic-appearing or diaphoretic.  HENT:     Head: Normocephalic.     Right Ear: Hearing, tympanic membrane, ear canal and external ear normal.     Left Ear: Hearing, tympanic membrane, ear canal and external ear normal.     Nose: Nose normal.     Mouth/Throat:     Mouth: Mucous membranes are moist.     Pharynx: Oropharynx is clear. Uvula midline.  Eyes:     General: Vision grossly intact.     Conjunctiva/sclera: Conjunctivae normal.  Neck:     Trachea: Trachea and phonation normal.  Cardiovascular:     Rate and Rhythm: Normal rate and regular rhythm.     Heart sounds: Normal heart sounds.  Pulmonary:     Effort: Pulmonary effort is normal.     Breath sounds: Normal breath sounds and air entry.  Musculoskeletal:        General: Normal range of motion.     Cervical back: Full passive range of motion without pain, normal range of motion and neck supple. No edema, erythema, signs of trauma, rigidity, torticollis or crepitus. No pain with movement, spinous process tenderness or muscular tenderness. Normal range of motion.  Lymphadenopathy:     Cervical: Cervical adenopathy present.     Right cervical: Superficial cervical adenopathy and posterior cervical adenopathy  present.  Skin:    General: Skin is warm and dry.  Neurological:     General: No focal deficit present.     Mental Status: She is alert and oriented to person, place, and time.     Sensory: Sensation is intact.     Motor: Motor function is intact.     Coordination: Coordination is intact.     Gait: Gait is intact.  Psychiatric:        Mood and Affect: Mood and affect normal.        Speech: Speech normal.        Behavior: Behavior is cooperative.      UC Treatments / Results  Labs (all labs ordered are listed, but only abnormal results are displayed) Labs Reviewed  CBC WITH DIFFERENTIAL/PLATELET  HIV ANTIBODY (ROUTINE TESTING W REFLEX)  EPSTEIN-BARR VIRUS (EBV) ANTIBODY PROFILE  EPSTEIN-BARR VIRUS VCA, IGG  EPSTEIN-BARR VIRUS VCA, IGM  CMV IGM  ANA W/REFLEX IF POSITIVE  SEDIMENTATION RATE  C-REACTIVE PROTEIN    EKG   Radiology No results found.  Procedures Procedures (including critical care time)  Medications Ordered in UC Medications - No data to display  Initial Impression / Assessment and Plan / UC Course  I have reviewed the triage vital signs and the nursing notes.  Pertinent labs & imaging results that were available during my care of the patient were reviewed by me and considered in my medical decision making (see chart for details).    Patient presents with a cervical lymph node that has been slowly enlarging over the past year, with acute onset of pain and rapid worsening in the last 24 hours. The pain is now interfering with swallowing and eating. The patient also reports achiness, warmth in the neck, low-grade fever, night sweats, neck and shoulder pain, and significant unintentional weight loss from 113 to 97  pounds, along with early satiety and decreased appetite. Given the chronic duration, recent acute symptoms, and concerning systemic findings, malignancy or other serious pathology must be ruled out. Blood work was ordered including CBC, HIV, EBV  panel, CMV, ANA, CRP, and ESR. Referral to ENT was made for further evaluation and potential biopsy. Supportive care includes moist heat application using a warm washcloth or low-setting heating pad. Patient advised to continue follow-up with hand specialist and dermatology for ongoing issues related to rash and trigger finger. Return to the ED if symptoms worsen, including increasing swelling, difficulty breathing, persistent high fever, or inability to tolerate oral intake.  Today's evaluation has revealed no signs of a dangerous process. Discussed diagnosis with patient and/or guardian. Patient and/or guardian aware of their diagnosis, possible red flag symptoms to watch out for and need for close follow up. Patient and/or guardian understands verbal and written discharge instructions. Patient and/or guardian comfortable with plan and disposition.  Patient and/or guardian has a clear mental status at this time, good insight into illness (after discussion and teaching) and has clear judgment to make decisions regarding their care  Documentation was completed with the aid of voice recognition software. Transcription may contain typographical errors.  Final Clinical Impressions(s) / UC Diagnoses   Final diagnoses:  Lymphadenopathy of head and neck     Discharge Instructions      You were seen today for a swollen lymph node in your neck that has been present for about a year but recently became painful and worsened significantly over the past 3 days. The pain is now affecting your ability to eat and swallow. You also reported low-grade fever, night sweats, weight loss, early fullness when eating, and decreased appetite. These symptoms may indicate a more serious condition, and further evaluation is needed. Blood work has been ordered to check for possible infections or inflammatory conditions.  You will only be notified if anything is abnormal; otherwise, you may view your results on MyChart. You  should follow-up with an ENT specialist who may perform a biopsy to better understand the cause of the swelling.  To help with discomfort at home, you can apply moist heat using a warm washcloth or a heating pad on a low setting to the affected area. Take the prescribed motrin  up to 3 times a day as needed.  Do not take any additional NSAIDs such as ibuprofen  or Aleve with this medication.  I have also prescribed you a stronger pain medication called oxycodone  that you can take as needed for severe pain. You may also take Tylenol  (acetaminophen ) 1000 mg every six hours for additional pain relief. This equals two 500 mg tablets at a time. Be careful not to take more than 4000 mg of Tylenol  in a 24-hour period.  Stay hydrated and try to eat soft foods if chewing or swallowing becomes difficult. Monitor for any changes in your symptoms.  Follow up promptly with the ENT specialist.   Go to the emergency department if you experience worsening pain, increased swelling, high fever, difficulty breathing, trouble swallowing fluids, or if you are unable to eat or drink due to pain.      ED Prescriptions     Medication Sig Dispense Auth. Provider   oxyCODONE  (ROXICODONE ) 5 MG immediate release tablet Take 1 tablet (5 mg total) by mouth every 4 (four) hours as needed for severe pain (pain score 7-10). 12 tablet Iola Lukes, FNP   ibuprofen  (ADVIL ) 800 MG tablet Take 1 tablet (800  mg total) by mouth every 8 (eight) hours as needed (pain). Take with food to avoid stomach upset. Do not take any additional NSAIDs while on this. You may take tylenol  in addition to this if needed for extra pain relief. 21 tablet Iola Lukes, FNP      I have reviewed the PDMP during this encounter.   Iola Lukes, [place] 06/03/24 [phone]

## 2024-06-03 NOTE — ED Triage Notes (Signed)
 Pt presents c/o a Lymph Node Concern. Pt says the issue initially started about a year ago but it's getting worse within last 3 days. Pt says there is no physical on the inside of her throat it just hurts when she swallows like she is swallowing a gold ball. Pt says her neck is sore and the area hurts to touch. The concern area is her neck and one in her vaginal area. Pt also c/o trigger finger on left hand with middle finger.

## 2024-06-04 DIAGNOSIS — I889 Nonspecific lymphadenitis, unspecified: Secondary | ICD-10-CM | POA: Diagnosis not present

## 2024-06-05 LAB — CBC WITH DIFFERENTIAL/PLATELET
Basophils Absolute: 0 x10E3/uL (ref 0.0–0.2)
Basos: 0 %
EOS (ABSOLUTE): 0.1 x10E3/uL (ref 0.0–0.4)
Eos: 1 %
Hematocrit: 41.1 % (ref 34.0–46.6)
Hemoglobin: 13.4 g/dL (ref 11.1–15.9)
Immature Grans (Abs): 0 x10E3/uL (ref 0.0–0.1)
Immature Granulocytes: 0 %
Lymphocytes Absolute: 0.8 x10E3/uL (ref 0.7–3.1)
Lymphs: 10 %
MCH: 31.3 pg (ref 26.6–33.0)
MCHC: 32.6 g/dL (ref 31.5–35.7)
MCV: 96 fL (ref 79–97)
Monocytes Absolute: 0.5 x10E3/uL (ref 0.1–0.9)
Monocytes: 7 %
Neutrophils Absolute: 5.9 x10E3/uL (ref 1.4–7.0)
Neutrophils: 82 %
Platelets: 290 x10E3/uL (ref 150–450)
RBC: 4.28 x10E6/uL (ref 3.77–5.28)
RDW: 11.9 % (ref 11.7–15.4)
WBC: 7.3 x10E3/uL (ref 3.4–10.8)

## 2024-06-05 LAB — EPSTEIN-BARR VIRUS (EBV) ANTIBODY PROFILE
EBV NA IgG: >600 U/mL — ABNORMAL HIGH (ref 0.0–17.9)
EBV VCA IgG: >600 U/mL — ABNORMAL HIGH (ref 0.0–17.9)
EBV VCA IgM: <36 U/mL (ref 0.0–35.9)

## 2024-06-05 LAB — ANA W/REFLEX IF POSITIVE: Anti Nuclear Antibody (ANA): NEGATIVE

## 2024-06-05 LAB — HIV ANTIBODY (ROUTINE TESTING W REFLEX): HIV Screen 4th Generation wRfx: NONREACTIVE

## 2024-06-05 LAB — SEDIMENTATION RATE: Sed Rate: 2 mm/h (ref 0–32)

## 2024-06-05 LAB — CMV IGM: CMV IgM Ser EIA-aCnc: <30 [AU]/ml (ref 0.0–29.9)

## 2024-06-05 LAB — C-REACTIVE PROTEIN: CRP: 7 mg/L (ref 0–10)

## 2024-06-06 ENCOUNTER — Ambulatory Visit (HOSPITAL_COMMUNITY): Payer: Self-pay

## 2024-06-06 NOTE — Telephone Encounter (Signed)
 Labs reveal evidence of previous Epstein-Barr infection which is not current.  All other results are normal.  Recommend follow-up with PCP to discuss further evaluation of enlarged and painful lymph node which may include ultrasound.

## 2024-07-08 DIAGNOSIS — M65332 Trigger finger, left middle finger: Secondary | ICD-10-CM | POA: Diagnosis not present

## 2024-07-08 DIAGNOSIS — M79644 Pain in right finger(s): Secondary | ICD-10-CM | POA: Diagnosis not present

## 2024-07-11 DIAGNOSIS — M2420 Disorder of ligament, unspecified site: Secondary | ICD-10-CM | POA: Diagnosis not present

## 2024-07-24 DIAGNOSIS — R221 Localized swelling, mass and lump, neck: Secondary | ICD-10-CM | POA: Diagnosis not present

## 2024-10-03 DIAGNOSIS — R0600 Dyspnea, unspecified: Secondary | ICD-10-CM | POA: Diagnosis not present

## 2024-10-03 DIAGNOSIS — J029 Acute pharyngitis, unspecified: Secondary | ICD-10-CM | POA: Diagnosis not present

## 2024-12-02 ENCOUNTER — Other Ambulatory Visit: Payer: Self-pay

## 2024-12-02 ENCOUNTER — Encounter (HOSPITAL_COMMUNITY): Payer: Self-pay | Admitting: Otolaryngology

## 2024-12-02 NOTE — Progress Notes (Signed)
 SDW call  Patient was given pre-op instructions over the phone. Patient verbalized understanding of instructions provided.  Patient was diagnosed with flu at Urgent Care 11/13/2024, states all symptoms resolved 11/17/2024. Denies any SOB, fever or cough   PCP - Marry Kins, FNP Cardiologist -  Pulmonary:    PPM/ICD - denies Device Orders - na Rep Notified - na   Chest x-ray - 11/13/2024, -[facility] EKG -   Stress Test - ECHO - 03/02/2017 - [facility] Cardiac Cath -   Sleep Study/sleep apnea/CPAP: denis  Non-diabetic  Blood Thinner Instructions: denies Aspirin Instructions:denies   ERAS Protcol - NPO   Anesthesia review: No  Your procedure is scheduled on Friday December 06, 2024  Report to [facility] Main Entrance A at  0530  A.M., then check in with the Admitting office.  Call this number if you have problems the morning of surgery:  [phone]   If you have any questions prior to your surgery date call [phone]: Open Monday-Friday 8am-4pm If you experience any cold or flu symptoms such as cough, fever, chills, shortness of breath, etc. between now and your scheduled surgery, please notify us  at the above number     Remember:  Do not eat or drink after midnight the night before your surgery  Take these medicines if needed the morning of surgery:  Albuterol   As of today, STOP taking any Aspirin (unless otherwise instructed by your surgeon) Aleve, Naproxen, Ibuprofen , Motrin , Advil , Goody's, BC's, all herbal medications, fish oil, and all vitamins.

## 2024-12-03 NOTE — H&P (Signed)
 HPI:   Natalie Simmons is a 24 y.o. female who presents as a return Patient.   Current problem: Throat.  HPI: Return visit. The clindamycin did not change her symptoms. She continues to have sore throat. She is also having pain in the left ear and jaw when she opens and closes. She has not been eating well lately because she cannot open her mouth all the way. She is adamant that she does not have a TMJ problem. She last saw her dentist about a year ago. She is having another problem which is shortness of breath and some difficulty breathing. It seems to come and go. She is also noticing some pain in the right shoulder area.  PMH/Meds/All/SocHx/FamHx/ROS:   Medical History[1]  Surgical History[2]  No family history of bleeding disorders, wound healing problems or difficulty with anesthesia.     Current Medications[3]   Physical Exam:   Healthy-appearing in no distress. She is quite anxious. Nasal exam is clear. Oral cavity and pharynx reveals healthy small tonsils. No inflammation seen. She has obvious subluxation of the left TMJ when she opens and closes. No palpable neck masses. Lungs are clear to auscultation bilaterally.  Independent Review of Additional Tests or Records:  none  Procedures:  none  Impression & Plans:  Evidence on CT imaging of deep-seated abnormality in the both tonsils. We did discuss about the possible role of tonsillectomy although I cannot guarantee it is going to help her.Natalie Simmons meets the indications for tonsillectomy. Risks and benefits were discussed in detail. All questions were answered. A handout was provided with additional details.  I am going to order chest x-ray given her recent breathing and shoulder symptoms.  TMJ dysfunction. Recommend she see her dentist about this as soon as possible.

## 2024-12-05 NOTE — Progress Notes (Signed)
 Left a message to pt's voicemail to arrive tom at 0730. Left a call back number.

## 2024-12-06 ENCOUNTER — Ambulatory Visit (HOSPITAL_COMMUNITY)
Admission: RE | Admit: 2024-12-06 | Discharge: 2024-12-06 | Disposition: A | Discharge status: Home / Self Care | Attending: Otolaryngology | Admitting: Otolaryngology

## 2024-12-06 ENCOUNTER — Encounter (HOSPITAL_COMMUNITY)
Admission: RE | Disposition: A | Discharge status: Home / Self Care | Payer: Self-pay | Source: Home / Self Care | Attending: Otolaryngology

## 2024-12-06 ENCOUNTER — Ambulatory Visit (HOSPITAL_COMMUNITY): Admitting: Anesthesiology

## 2024-12-06 ENCOUNTER — Other Ambulatory Visit: Payer: Self-pay

## 2024-12-06 ENCOUNTER — Encounter (HOSPITAL_COMMUNITY): Payer: Self-pay | Admitting: Otolaryngology

## 2024-12-06 DIAGNOSIS — R0600 Dyspnea, unspecified: Secondary | ICD-10-CM | POA: Insufficient documentation

## 2024-12-06 DIAGNOSIS — J029 Acute pharyngitis, unspecified: Secondary | ICD-10-CM | POA: Diagnosis present

## 2024-12-06 DIAGNOSIS — J45909 Unspecified asthma, uncomplicated: Secondary | ICD-10-CM | POA: Diagnosis not present

## 2024-12-06 HISTORY — PX: TONSILLECTOMY: SHX5217

## 2024-12-06 LAB — POCT PREGNANCY, URINE: Preg Test, Ur: NEGATIVE

## 2024-12-06 SURGERY — TONSILLECTOMY
Anesthesia: General | Laterality: Bilateral

## 2024-12-06 MED ORDER — ROCURONIUM BROMIDE 10 MG/ML (PF) SYRINGE
PREFILLED_SYRINGE | INTRAVENOUS | Status: DC | PRN
  Administered 2024-12-06: 30 mg via INTRAVENOUS

## 2024-12-06 MED ORDER — HYDROCODONE-ACETAMINOPHEN 7.5-325 MG/15ML PO SOLN: 15.0000 mL | Freq: Four times a day (QID) | ORAL | 0 refills | Status: AC | PRN

## 2024-12-06 MED ORDER — FENTANYL CITRATE (PF) 100 MCG/2ML IJ SOLN
25.0000 ug | INTRAMUSCULAR | Status: DC | PRN
  Administered 2024-12-06: 25 ug via INTRAVENOUS

## 2024-12-06 MED ORDER — OXYCODONE HCL 5 MG/5ML PO SOLN
ORAL | Status: AC
  Filled 2024-12-06: qty 5

## 2024-12-06 MED ORDER — OXYCODONE HCL 5 MG/5ML PO SOLN
5.0000 mg | Freq: Once | ORAL | Status: AC | PRN
  Administered 2024-12-06: 5 mg via ORAL

## 2024-12-06 MED ORDER — ONDANSETRON HCL 4 MG/2ML IJ SOLN: 4.0000 mg | Freq: Once | INTRAMUSCULAR | Status: DC | PRN

## 2024-12-06 MED ORDER — CHLORHEXIDINE GLUCONATE 0.12 % MT SOLN
15.0000 mL | Freq: Once | OROMUCOSAL | Status: AC
  Administered 2024-12-06: 15 mL via OROMUCOSAL
  Filled 2024-12-06: qty 15

## 2024-12-06 MED ORDER — DEXAMETHASONE SOD PHOSPHATE PF 10 MG/ML IJ SOLN
INTRAMUSCULAR | Status: DC | PRN
  Administered 2024-12-06: 5 mg via INTRAVENOUS

## 2024-12-06 MED ORDER — MIDAZOLAM HCL 2 MG/2ML IJ SOLN
INTRAMUSCULAR | Status: AC
  Filled 2024-12-06: qty 2

## 2024-12-06 MED ORDER — ONDANSETRON 4 MG PO TBDP: 4.0000 mg | ORAL_TABLET | Freq: Three times a day (TID) | ORAL | 0 refills | Status: AC | PRN

## 2024-12-06 MED ORDER — FENTANYL CITRATE (PF) 250 MCG/5ML IJ SOLN
INTRAMUSCULAR | Status: AC
  Filled 2024-12-06: qty 5

## 2024-12-06 MED ORDER — MIDAZOLAM HCL (PF) 2 MG/2ML IJ SOLN
INTRAMUSCULAR | Status: DC | PRN
  Administered 2024-12-06: 1 mg via INTRAVENOUS

## 2024-12-06 MED ORDER — ACETAMINOPHEN 500 MG PO TABS
1000.0000 mg | ORAL_TABLET | Freq: Once | ORAL | Status: AC
  Administered 2024-12-06: 1000 mg via ORAL
  Filled 2024-12-06: qty 2

## 2024-12-06 MED ORDER — DEXMEDETOMIDINE HCL IN NACL 80 MCG/20ML IV SOLN
INTRAVENOUS | Status: DC | PRN
  Administered 2024-12-06: 12 ug via INTRAVENOUS

## 2024-12-06 MED ORDER — OXYCODONE HCL 5 MG PO TABS: 5.0000 mg | ORAL_TABLET | Freq: Once | ORAL | Status: AC | PRN

## 2024-12-06 MED ORDER — LIDOCAINE HCL (CARDIAC) PF 100 MG/5ML IV SOSY
PREFILLED_SYRINGE | INTRAVENOUS | Status: DC | PRN
  Administered 2024-12-06: 50 mg via INTRAVENOUS

## 2024-12-06 MED ORDER — LACTATED RINGERS IV SOLN: INTRAVENOUS | Status: DC

## 2024-12-06 MED ORDER — PROPOFOL 10 MG/ML IV BOLUS
INTRAVENOUS | Status: DC | PRN
  Administered 2024-12-06: 100 mg via INTRAVENOUS

## 2024-12-06 MED ORDER — ORAL CARE MOUTH RINSE: 15.0000 mL | Freq: Once | OROMUCOSAL | Status: AC

## 2024-12-06 MED ORDER — FENTANYL CITRATE (PF) 250 MCG/5ML IJ SOLN
INTRAMUSCULAR | Status: DC | PRN
  Administered 2024-12-06 (×2): 50 ug via INTRAVENOUS
  Administered 2024-12-06: 75 ug via INTRAVENOUS
  Administered 2024-12-06: 25 ug via INTRAVENOUS

## 2024-12-06 MED ORDER — MEPERIDINE HCL 25 MG/ML IJ SOLN: 6.2500 mg | INTRAMUSCULAR | Status: DC | PRN

## 2024-12-06 MED ORDER — 0.9 % SODIUM CHLORIDE (POUR BTL) OPTIME
TOPICAL | Status: DC | PRN
  Administered 2024-12-06: 1000 mL

## 2024-12-06 MED ORDER — GLYCOPYRROLATE 0.2 MG/ML IJ SOLN
INTRAMUSCULAR | Status: DC | PRN
  Administered 2024-12-06: .1 mg via INTRAVENOUS

## 2024-12-06 MED ORDER — ONDANSETRON HCL 4 MG/2ML IJ SOLN
INTRAMUSCULAR | Status: DC | PRN
  Administered 2024-12-06: 4 mg via INTRAVENOUS

## 2024-12-06 MED ORDER — SUGAMMADEX SODIUM 200 MG/2ML IV SOLN
INTRAVENOUS | Status: DC | PRN
  Administered 2024-12-06: 150 mg via INTRAVENOUS

## 2024-12-06 MED ORDER — FENTANYL CITRATE (PF) 100 MCG/2ML IJ SOLN
INTRAMUSCULAR | Status: AC
  Filled 2024-12-06: qty 2

## 2024-12-06 SURGICAL SUPPLY — 27 items
BAG COUNTER SPONGE SURGICOUNT (BAG) ×1 IMPLANT
BLADE SURG 15 STRL LF DISP TIS (BLADE) IMPLANT
CANISTER SUCTION 3000ML PPV (SUCTIONS) ×1 IMPLANT
CATH ROBINSON RED A/P 10FR (CATHETERS) IMPLANT
CLEANER TIP ELECTROSURG 2X2 (MISCELLANEOUS) ×1 IMPLANT
COAGULATOR SUCT SWTCH 10FR 6 (ELECTROSURGICAL) ×1 IMPLANT
DRAPE HALF SHEET 40X57 (DRAPES) IMPLANT
ELECT COATED BLADE 2.86 ST (ELECTRODE) ×1 IMPLANT
ELECTRODE REM PT RETRN 9FT PED (ELECTROSURGICAL) IMPLANT
ELECTRODE REM PT RTRN 9FT ADLT (ELECTROSURGICAL) IMPLANT
GAUZE 4X4 16PLY ~~LOC~~+RFID DBL (SPONGE) ×1 IMPLANT
GLOVE BIOGEL PI MICRO STRL 7.5 (GLOVE) ×1 IMPLANT
GOWN STRL REUS W/ TWL LRG LVL3 (GOWN DISPOSABLE) ×2 IMPLANT
KIT BASIN OR (CUSTOM PROCEDURE TRAY) ×1 IMPLANT
KIT TURNOVER KIT B (KITS) ×1 IMPLANT
NEEDLE PRECISIONGLIDE 27X1.5 (NEEDLE) IMPLANT
PACK SRG BSC III STRL LF ECLPS (CUSTOM PROCEDURE TRAY) ×1 IMPLANT
PAD ARMBOARD POSITIONER FOAM (MISCELLANEOUS) ×2 IMPLANT
PENCIL FOOT CONTROL (ELECTRODE) ×1 IMPLANT
SOLN 0.9% NACL POUR BTL 1000ML (IV SOLUTION) ×1 IMPLANT
SOLN STERILE WATER BTL 1000 ML (IV SOLUTION) ×1 IMPLANT
SPONGE TONSIL 1.25 RF SGL STRG (GAUZE/BANDAGES/DRESSINGS) IMPLANT
SYR BULB EAR ULCER 3OZ GRN STR (SYRINGE) ×1 IMPLANT
TOWEL GREEN STERILE FF (TOWEL DISPOSABLE) ×1 IMPLANT
TUBE CONNECTING 12X1/4 (SUCTIONS) ×1 IMPLANT
TUBE SALEM SUMP 12F (TUBING) ×1 IMPLANT
TUBE SALEM SUMP 14F (TUBING) ×1 IMPLANT

## 2024-12-06 NOTE — Anesthesia Procedure Notes (Signed)
 Procedure Name: Intubation Date/Time: 12/06/2024 9:49 AM  Performed by: Cindie Donald CROME, CRNAPre-anesthesia Checklist: Patient identified, Emergency Drugs available, Suction available and Patient being monitored Patient Re-evaluated:Patient Re-evaluated prior to induction Oxygen Delivery Method: Circle System Utilized Preoxygenation: Pre-oxygenation with 100% oxygen Induction Type: IV induction Ventilation: Mask ventilation without difficulty Laryngoscope Size: Mac and 3 Grade View: Grade I Tube type: Oral Tube size: 7.0 mm Number of attempts: 1 Airway Equipment and Method: Stylet Placement Confirmation: ETT inserted through vocal cords under direct vision, positive ETCO2 and breath sounds checked- equal and bilateral Secured at: 22 cm Tube secured with: Tape Dental Injury: Teeth and Oropharynx as per pre-operative assessment

## 2024-12-06 NOTE — Anesthesia Postprocedure Evaluation (Signed)
"   Anesthesia Post Note  Patient: Counselling Psychologist  Procedure(s) Performed: TONSILLECTOMY (Bilateral)     Patient location during evaluation: PACU Anesthesia Type: General Level of consciousness: awake and alert Pain management: pain level controlled Vital Signs Assessment: post-procedure vital signs reviewed and stable Respiratory status: spontaneous breathing, nonlabored ventilation, respiratory function stable and patient connected to nasal cannula oxygen Cardiovascular status: blood pressure returned to baseline and stable Postop Assessment: no apparent nausea or vomiting Anesthetic complications: no   No notable events documented.  Last Vitals:  Vitals:   12/06/24 1028 12/06/24 1030  BP: (!) 106/50 (!) 92/50  Pulse: 91 81  Resp: 20 10  Temp: 36.4 C   SpO2: 94% 94%    Last Pain:  Vitals:   12/06/24 1030  TempSrc:   PainSc: Asleep                 Nimah Uphoff      "

## 2024-12-06 NOTE — Anesthesia Preprocedure Evaluation (Addendum)
"                                    Anesthesia Evaluation  Patient identified by MRN, date of birth, ID band Patient awake    Reviewed: Allergy & Precautions, H&P , NPO status , Patient's Chart, lab work & pertinent test results  Airway Mallampati: II  TM Distance: >3 FB Neck ROM: Full    Dental no notable dental hx. (+) Teeth Intact, Dental Advisory Given   Pulmonary neg pulmonary ROS, asthma    Pulmonary exam normal breath sounds clear to auscultation       Cardiovascular Exercise Tolerance: Good negative cardio ROS Normal cardiovascular exam Rhythm:Regular Rate:Normal     Neuro/Psych   Anxiety     negative neurological ROS  negative psych ROS   GI/Hepatic negative GI ROS, Neg liver ROS,,,  Endo/Other  negative endocrine ROS    Renal/GU negative Renal ROS  negative genitourinary   Musculoskeletal negative musculoskeletal ROS (+)    Abdominal   Peds negative pediatric ROS (+)  Hematology negative hematology ROS (+)   Anesthesia Other Findings   Reproductive/Obstetrics negative OB ROS                              Anesthesia Physical Anesthesia Plan  ASA: 1  Anesthesia Plan: General   Post-op Pain Management: Minimal or no pain anticipated and Tylenol  PO (pre-op)*   Induction: Intravenous  PONV Risk Score and Plan: 3 and Ondansetron  and Dexamethasone   Airway Management Planned: Oral ETT  Additional Equipment: None  Intra-op Plan:   Post-operative Plan: Extubation in OR  Informed Consent: I have reviewed the patients History and Physical, chart, labs and discussed the procedure including the risks, benefits and alternatives for the proposed anesthesia with the patient or authorized representative who has indicated his/her understanding and acceptance.       Plan Discussed with: Anesthesiologist and CRNA  Anesthesia Plan Comments: (  )         Anesthesia Quick Evaluation  "

## 2024-12-06 NOTE — Transfer of Care (Signed)
 Immediate Anesthesia Transfer of Care Note  Patient: Natalie Simmons  Procedure(s) Performed: TONSILLECTOMY (Bilateral)  Patient Location: PACU  Anesthesia Type:General  Level of Consciousness: awake and drowsy  Airway & Oxygen Therapy: Patient Spontanous Breathing  Post-op Assessment: Report given to RN and Post -op Vital signs reviewed and stable  Post vital signs: Reviewed and stable  Last Vitals:  Vitals Value Taken Time  BP 92/50 12/06/24 10:30  Temp 36.4 C 12/06/24 10:28  Pulse 83 12/06/24 10:31  Resp 11 12/06/24 10:31  SpO2 95 % 12/06/24 10:31  Vitals shown include unfiled device data.  Last Pain:  Vitals:   12/06/24 1030  TempSrc:   PainSc: Asleep      Patients Stated Pain Goal: 1 (12/06/24 0815)  Complications: No notable events documented.

## 2024-12-06 NOTE — Op Note (Signed)
 12/06/2024  10:17 AM  PATIENT:  Natalie Simmons  24 y.o. female  PRE-OPERATIVE DIAGNOSIS:  Dyspnea, unspecified type Sore throat  POST-OPERATIVE DIAGNOSIS:  Dyspnea, unspecified typeSore throat  PROCEDURE:  Procedures: TONSILLECTOMY  SURGEON:  Surgeon(s): Jesus Oliphant, MD  ANESTHESIA:   General  COUNTS: Correct   DICTATION: The patient was taken to the operating room and placed on the operating table in the supine position. Following induction of general endotracheal anesthesia, the table was turned and the patient was draped in a standard fashion. A Crowe-Davis mouthgag was inserted into the oral cavity and used to retract the tongue and mandible, then attached to the Mayo stand.  The tonsillectomy was then performed using electrocautery dissection, carefully dissecting the avascular plane between the capsule and constrictor muscles. Cautery was used for completion of hemostasis. The tonsils were large and deeply located within the constrictor muscle and a large amount of tonsil debris exuded during the dissection. They were discarded.  The pharynx was irrigated with saline and suctioned. An oral gastric tube was used to aspirate the contents of the stomach. The patient was then awakened from anesthesia and transferred to PACU in stable condition.   PATIENT DISPOSITION:  To PACU, stable

## 2024-12-06 NOTE — Interval H&P Note (Signed)
 History and Physical Interval Note:  12/06/2024 9:17 AM  Natalie Simmons  has presented today for surgery, with the diagnosis of Dyspnea, unspecified type Sore throat.  The various methods of treatment have been discussed with the patient and family. After consideration of risks, benefits and other options for treatment, the patient has consented to  Procedures: TONSILLECTOMY (Bilateral) as a surgical intervention.  The patient's history has been reviewed, patient examined, no change in status, stable for surgery.  I have reviewed the patient's chart and labs.  Questions were answered to the patient's satisfaction.     Ida Loader

## 2024-12-06 NOTE — Progress Notes (Signed)
 Per Dr. Mallory, no pre-op labs needed.    Joesph LOISE Stake, RN

## 2024-12-07 ENCOUNTER — Encounter (HOSPITAL_COMMUNITY): Payer: Self-pay | Admitting: Otolaryngology
